# Patient Record
Sex: Male | Born: 1995
Health system: Southern US, Community
[De-identification: ages and names within clinical notes are randomized; demographics above are authoritative.]

## PROBLEM LIST (undated history)

## (undated) DIAGNOSIS — I10 Essential (primary) hypertension: Secondary | ICD-10-CM

## (undated) DIAGNOSIS — B20 Human immunodeficiency virus [HIV] disease: Secondary | ICD-10-CM

## (undated) DIAGNOSIS — R569 Unspecified convulsions: Secondary | ICD-10-CM

---
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
## 2018-12-22 ENCOUNTER — Emergency Department (HOSPITAL_COMMUNITY)
Admission: EM | Admit: 2018-12-22 | Discharge: 2018-12-22 | Disposition: A | Discharge status: Home / Self Care | Payer: Self-pay | Attending: Emergency Medicine | Admitting: Emergency Medicine

## 2018-12-22 ENCOUNTER — Encounter (HOSPITAL_COMMUNITY): Payer: Self-pay | Admitting: Emergency Medicine

## 2018-12-22 ENCOUNTER — Emergency Department (HOSPITAL_COMMUNITY): Payer: Self-pay

## 2018-12-22 ENCOUNTER — Other Ambulatory Visit: Payer: Self-pay

## 2018-12-22 DIAGNOSIS — R569 Unspecified convulsions: Secondary | ICD-10-CM | POA: Insufficient documentation

## 2018-12-22 DIAGNOSIS — F17210 Nicotine dependence, cigarettes, uncomplicated: Secondary | ICD-10-CM | POA: Insufficient documentation

## 2018-12-22 DIAGNOSIS — I10 Essential (primary) hypertension: Secondary | ICD-10-CM | POA: Insufficient documentation

## 2018-12-22 HISTORY — DX: Essential (primary) hypertension: I10

## 2018-12-22 LAB — CBC WITH DIFFERENTIAL/PLATELET
Abs Immature Granulocytes: 0.02 10*3/uL (ref 0.00–0.07)
Basophils Absolute: 0 10*3/uL (ref 0.0–0.1)
Basophils Relative: 0 %
Eosinophils Absolute: 0 10*3/uL (ref 0.0–0.5)
Eosinophils Relative: 0 %
HCT: 40.7 % (ref 39.0–52.0)
Hemoglobin: 13 g/dL (ref 13.0–17.0)
Immature Granulocytes: 1 %
Lymphocytes Relative: 39 %
Lymphs Abs: 1.2 10*3/uL (ref 0.7–4.0)
MCH: 27.1 pg (ref 26.0–34.0)
MCHC: 31.9 g/dL (ref 30.0–36.0)
MCV: 84.8 fL (ref 80.0–100.0)
Monocytes Absolute: 0.2 10*3/uL (ref 0.1–1.0)
Monocytes Relative: 8 %
Neutro Abs: 1.6 10*3/uL — ABNORMAL LOW (ref 1.7–7.7)
Neutrophils Relative %: 52 %
Platelets: 160 10*3/uL (ref 150–400)
RBC: 4.8 MIL/uL (ref 4.22–5.81)
RDW: 12.9 % (ref 11.5–15.5)
WBC: 3.1 10*3/uL — ABNORMAL LOW (ref 4.0–10.5)
nRBC: 0 % (ref 0.0–0.2)

## 2018-12-22 LAB — COMPREHENSIVE METABOLIC PANEL
ALT: 203 U/L — ABNORMAL HIGH (ref 0–44)
AST: 231 U/L — ABNORMAL HIGH (ref 15–41)
Albumin: 3.5 g/dL (ref 3.5–5.0)
Alkaline Phosphatase: 35 U/L — ABNORMAL LOW (ref 38–126)
Anion gap: 10 (ref 5–15)
BUN: 8 mg/dL (ref 6–20)
CO2: 25 mmol/L (ref 22–32)
Calcium: 9.1 mg/dL (ref 8.9–10.3)
Chloride: 103 mmol/L (ref 98–111)
Creatinine, Ser: 0.46 mg/dL — ABNORMAL LOW (ref 0.61–1.24)
GFR calc Af Amer: >60 mL/min (ref >60)
GFR calc non Af Amer: >60 mL/min (ref >60)
Glucose, Bld: 85 mg/dL (ref 70–99)
Potassium: 3.8 mmol/L (ref 3.5–5.1)
Sodium: 138 mmol/L (ref 135–145)
Total Bilirubin: 0.5 mg/dL (ref 0.3–1.2)
Total Protein: 7.7 g/dL (ref 6.5–8.1)

## 2018-12-22 LAB — URINALYSIS, ROUTINE W REFLEX MICROSCOPIC
Bilirubin Urine: NEGATIVE
Glucose, UA: NEGATIVE mg/dL
Ketones, ur: NEGATIVE mg/dL
Leukocytes,Ua: NEGATIVE
Nitrite: NEGATIVE
Protein, ur: NEGATIVE mg/dL
Specific Gravity, Urine: 1.021 (ref 1.005–1.030)
pH: 5 (ref 5.0–8.0)

## 2018-12-22 LAB — MAGNESIUM: Magnesium: 2 mg/dL (ref 1.7–2.4)

## 2018-12-22 LAB — ETHANOL: Alcohol, Ethyl (B): <10 mg/dL (ref <10)

## 2018-12-22 LAB — PHOSPHORUS: Phosphorus: 2.7 mg/dL (ref 2.5–4.6)

## 2018-12-22 LAB — CBG MONITORING, ED: Glucose-Capillary: 76 mg/dL (ref 70–99)

## 2018-12-22 NOTE — ED Notes (Signed)
Pts aunt is in waiting area ready to pick pt up. Patient verbalizes understanding of discharge instructions. Opportunity for questioning and answers were provided. Armband removed by staff, pt discharged from ED.

## 2018-12-22 NOTE — ED Provider Notes (Signed)
[facility] Provider Note   CSN: [id number] Arrival date & time: 12/22/18  [id number]    History   Chief Complaint Chief Complaint  Patient presents with  . Loss of Consciousness    HPI Jeremy DoppMarcus Lane is a 23 y.o. male with no PMHx presenting to the ED after possible seizure activity. Patient reports the last thing he remembers was getting off the bus as he was heading to court and he felt his muscles tense up before he blacked out. He states about 3 days ago he started feeling numbness and weakness in his left leg that has made it difficult to ambulate. Endorses headache, SOB and nausea. He denies any history of seizures, recent injuries or trauma, no chest pain, palpitations, lightheadedness or dizziness, fever, chills, abdominal pain, vomiting, constipation, diarrhea or difficulty urinating. He has a history of heavy alcohol use but states he has not drank in about 3 weeks. Reports using marijuana a couple times a week. No known family history of seizures or arrhythmias.     HPI  Past Medical History:  Diagnosis Date  . Hypertension     There are no active problems to display for this patient.    Home Medications    Prior to Admission medications   Not on File    Family History No family history on file.  Social History Social History   Tobacco Use  . Smoking status: Current Every Day Smoker    Packs/day: 0.20    Types: Cigarettes  . Smokeless tobacco: Never Used  Substance Use Topics  . Alcohol use: Yes    Comment: states he has been drinking more lately because he is stressed  . Drug use: Yes    Types: Marijuana, Cocaine    Comment: cocaine "once in a blue moon"     Allergies   Patient has no known allergies.   Review of Systems Review of Systems  Constitutional: Negative for appetite change, chills, diaphoresis, fatigue and fever.  HENT: Negative for congestion, rhinorrhea and sore throat.   Eyes: Negative for  photophobia, pain, redness and visual disturbance.  Respiratory: Positive for shortness of breath. Negative for chest tightness.   Cardiovascular: Negative for chest pain and palpitations.  Gastrointestinal: Positive for nausea. Negative for abdominal pain, constipation, diarrhea and vomiting.  Genitourinary: Negative for difficulty urinating, flank pain and hematuria.  Musculoskeletal: Positive for gait problem. Negative for neck pain and neck stiffness.  Neurological: Positive for weakness, numbness and headaches. Negative for dizziness.  Psychiatric/Behavioral: Negative for agitation and confusion. The patient is not nervous/anxious.      Physical Exam Updated Vital Signs BP 117/72   Pulse 75   Temp 98.4 F (36.9 C) (Oral)   Resp (!) 21   Ht 5\' 4"  (1.626 m)   Wt 63.5 kg   SpO2 100%   BMI 24.03 kg/m   Physical Exam Constitutional:      General: He is not in acute distress.    Appearance: Normal appearance. He is normal weight. He is not ill-appearing or toxic-appearing.  HENT:     Head: Normocephalic and atraumatic.     Mouth/Throat:     Mouth: Mucous membranes are dry.     Pharynx: Oropharynx is clear.  Eyes:     General:        Right eye: No discharge.        Left eye: No discharge.     Extraocular Movements: Extraocular movements intact.  Conjunctiva/sclera: Conjunctivae normal.     Pupils: Pupils are equal, round, and reactive to light.  Neck:     Musculoskeletal: Normal range of motion and neck supple. No neck rigidity or muscular tenderness.  Cardiovascular:     Rate and Rhythm: Normal rate and regular rhythm.     Pulses: Normal pulses.     Heart sounds: Normal heart sounds. No murmur. No friction rub. No gallop.   Pulmonary:     Effort: Pulmonary effort is normal. No respiratory distress.     Breath sounds: Normal breath sounds. No wheezing or rales.  Chest:     Chest wall: No tenderness.  Abdominal:     General: Abdomen is flat. Bowel sounds are  normal. There is no distension.     Palpations: Abdomen is soft.     Tenderness: There is abdominal tenderness. There is no guarding.  Musculoskeletal:        General: No swelling or tenderness.     Right lower leg: No edema.     Left lower leg: No edema.  Skin:    General: Skin is warm and dry.  Neurological:     Mental Status: He is alert and oriented to person, place, and time.     Cranial Nerves: No cranial nerve deficit.     Sensory: No sensory deficit.     Motor: Weakness present.     Comments: Decreased strength of LLE, 3/5  Psychiatric:        Mood and Affect: Mood normal.        Behavior: Behavior normal.        Thought Content: Thought content normal.        Judgment: Judgment normal.      ED Treatments / Results  Labs (all labs ordered are listed, but only abnormal results are displayed) Labs Reviewed  COMPREHENSIVE METABOLIC PANEL - Abnormal; Notable for the following components:      Result Value   Creatinine, Ser 0.46 (*)    AST 231 (*)    ALT 203 (*)    Alkaline Phosphatase 35 (*)    All other components within normal limits  CBC WITH DIFFERENTIAL/PLATELET - Abnormal; Notable for the following components:   WBC 3.1 (*)    Neutro Abs 1.6 (*)    All other components within normal limits  URINALYSIS, ROUTINE W REFLEX MICROSCOPIC - Abnormal; Notable for the following components:   Hgb urine dipstick MODERATE (*)    Bacteria, UA RARE (*)    All other components within normal limits  ETHANOL  MAGNESIUM  PHOSPHORUS  CBG MONITORING, ED    EKG EKG Interpretation  Date/Time:  Monday Dec 22 2018 10:03:48 EDT Ventricular Rate:  68 PR Interval:    QRS Duration: 85 QT Interval:  415 QTC Calculation: 442 R Axis:   -2 Text Interpretation:  Sinus rhythm RSR' in V1 or V2, probably normal variant no wpw, prolonged qt or brugada No old tracing to compare Confirmed by Melene Plan [phone]) on 12/22/2018 12:47:19 PM   Radiology Dg Ribs Unilateral W/chest Left   Result Date: 12/22/2018 CLINICAL DATA:  Left rib pain after fall. EXAM: LEFT RIBS AND CHEST - 3+ VIEW COMPARISON:  None. FINDINGS: No fracture or other bone lesions are seen involving the ribs. There is no evidence of pneumothorax or pleural effusion. Both lungs are clear. Heart size and mediastinal contours are within normal limits. IMPRESSION: Negative. Electronically Signed   By: Lupita Raider M.D.   On:  12/22/2018 12:04   Ct Head Wo Contrast  Result Date: 12/22/2018 CLINICAL DATA:  Syncopal episode with suspected seizure EXAM: CT HEAD WITHOUT CONTRAST TECHNIQUE: Contiguous axial images were obtained from the base of the skull through the vertex without intravenous contrast. COMPARISON:  None. FINDINGS: Brain: The ventricles are normal in size and configuration. There is no intracranial mass, hemorrhage, extra-axial fluid collection, or midline shift. The brain parenchyma appears unremarkable. No evident acute infarct. Vascular: No hyperdense vessel. There is no demonstrable vascular calcification. Skull: The bony calvarium appears intact. Sinuses/Orbits: There is opacification in several ethmoid air cells bilaterally. There is mucosal thickening in the anterior superior right maxillary antrum. Orbits appear symmetric bilaterally. Other: Mastoid air cells are clear. IMPRESSION: Areas of paranasal sinus disease.  Study otherwise unremarkable. Electronically Signed   By: Bretta Bang III M.D.   On: 12/22/2018 13:09    Procedures Procedures (including critical care time)  Medications Ordered in ED Medications - No data to display   Initial Impression / Assessment and Plan / ED Course  I have reviewed the triage vital signs and the nursing notes.  Pertinent labs & imaging results that were available during my care of the patient were reviewed by me and considered in my medical decision making (see chart for details).  Pt is a 23 yo presenting to the ED after possible seizure like activity.  Patient was confused upon EMS arrival, appeared post-ictal. He was getting off a bus this morning and the last thing he recalls was his muscles tensing up before he blacked out. He reports left leg weakness and numbness that started three days ago. No history of seizures or syncope. Endorses headache, SOB and nausea. Also states he recently quit drinking alcohol about 3 weeks ago, prior to this he reports drinking heavily. No history of alcohol withdrawal. Will order seizure workup and CT head.   CBG 135. UA shows moderate hgb. Mild transaminitis. CT head unremarkable. Patient is stable for discharge home. Will advise patient to avoid driving, swimming, climbing ladders etc until he is cleared by his PCP or neurology.   Final Clinical Impressions(s) / ED Diagnoses   Final diagnoses:  Seizure-like activity [facility])    ED Discharge Orders    None       Rehman, Areeg N, DO 12/22/18 1340    Melene Plan, DO 12/22/18 1349

## 2018-12-22 NOTE — ED Notes (Signed)
Patient transported to xray/CT. 

## 2018-12-22 NOTE — ED Triage Notes (Signed)
Patient in via [facility] after possible seizure - patient remembers getting off the bus this morning while hading to court and states he felt his foot go numb, muscles tense up, and then he blacked out. He denies hx seizures, denies pain or any recent symptoms at all. No recent drug use or ETOH. Patient confused upon EMS arrival, appeared post-ictal, but is now A&O x 4. Incontinent of bowel. No tongue injury or obvious injury. Only history HTN, no medications.  EMS VS: 132/96, HR 80 NSR, 100% RA, 96.63F temporal, CBG 135. 18g. PIV LFA.

## 2018-12-22 NOTE — Discharge Instructions (Addendum)
You presented to the ED with possible seizure like activity. Your labs and head CT were normal. Please do not drive, swim, or any other activities that would be dangerous if you had another seizure. I want you to call [phone] for a PCP or follow up with neurology in 1 week.  I have provided their contact information.  If your symptoms persist or worsen or if you have any more seizure like activity please return to the ED.

## 2018-12-22 NOTE — ED Notes (Signed)
Patient left his fiance, Leeanne Rio, a voicemail. He can be reached at [phone].

## 2018-12-27 ENCOUNTER — Observation Stay (HOSPITAL_COMMUNITY): Payer: Self-pay

## 2018-12-27 ENCOUNTER — Encounter (HOSPITAL_COMMUNITY): Payer: Self-pay | Admitting: Emergency Medicine

## 2018-12-27 ENCOUNTER — Inpatient Hospital Stay (HOSPITAL_COMMUNITY)
Admission: EM | Admit: 2018-12-27 | Discharge: 2019-01-03 | DRG: 975 | Discharge status: Against Medical Advice | Payer: Self-pay | Attending: Internal Medicine | Admitting: Internal Medicine

## 2018-12-27 DIAGNOSIS — R748 Abnormal levels of other serum enzymes: Secondary | ICD-10-CM

## 2018-12-27 DIAGNOSIS — F603 Borderline personality disorder: Secondary | ICD-10-CM | POA: Diagnosis present

## 2018-12-27 DIAGNOSIS — M6282 Rhabdomyolysis: Secondary | ICD-10-CM | POA: Diagnosis present

## 2018-12-27 DIAGNOSIS — F4329 Adjustment disorder with other symptoms: Secondary | ICD-10-CM | POA: Diagnosis present

## 2018-12-27 DIAGNOSIS — F1011 Alcohol abuse, in remission: Secondary | ICD-10-CM | POA: Diagnosis present

## 2018-12-27 DIAGNOSIS — B20 Human immunodeficiency virus [HIV] disease: Secondary | ICD-10-CM

## 2018-12-27 DIAGNOSIS — G40201 Localization-related (focal) (partial) symptomatic epilepsy and epileptic syndromes with complex partial seizures, not intractable, with status epilepticus: Secondary | ICD-10-CM

## 2018-12-27 DIAGNOSIS — F319 Bipolar disorder, unspecified: Secondary | ICD-10-CM | POA: Diagnosis present

## 2018-12-27 DIAGNOSIS — I10 Essential (primary) hypertension: Secondary | ICD-10-CM | POA: Diagnosis present

## 2018-12-27 DIAGNOSIS — R45851 Suicidal ideations: Secondary | ICD-10-CM | POA: Diagnosis present

## 2018-12-27 DIAGNOSIS — Z20828 Contact with and (suspected) exposure to other viral communicable diseases: Secondary | ICD-10-CM | POA: Diagnosis present

## 2018-12-27 DIAGNOSIS — R7989 Other specified abnormal findings of blood chemistry: Secondary | ICD-10-CM | POA: Diagnosis present

## 2018-12-27 DIAGNOSIS — G9349 Other encephalopathy: Secondary | ICD-10-CM | POA: Diagnosis present

## 2018-12-27 DIAGNOSIS — F1910 Other psychoactive substance abuse, uncomplicated: Secondary | ICD-10-CM | POA: Diagnosis not present

## 2018-12-27 DIAGNOSIS — R569 Unspecified convulsions: Secondary | ICD-10-CM

## 2018-12-27 DIAGNOSIS — F17210 Nicotine dependence, cigarettes, uncomplicated: Secondary | ICD-10-CM | POA: Diagnosis present

## 2018-12-27 DIAGNOSIS — F4320 Adjustment disorder, unspecified: Secondary | ICD-10-CM | POA: Diagnosis present

## 2018-12-27 HISTORY — DX: Human immunodeficiency virus (HIV) disease: B20

## 2018-12-27 LAB — COMPREHENSIVE METABOLIC PANEL
ALT: 187 U/L — ABNORMAL HIGH (ref 0–44)
AST: 199 U/L — ABNORMAL HIGH (ref 15–41)
Albumin: 3.5 g/dL (ref 3.5–5.0)
Alkaline Phosphatase: 39 U/L (ref 38–126)
Anion gap: 9 (ref 5–15)
BUN: 9 mg/dL (ref 6–20)
CO2: 26 mmol/L (ref 22–32)
Calcium: 9.4 mg/dL (ref 8.9–10.3)
Chloride: 101 mmol/L (ref 98–111)
Creatinine, Ser: 0.42 mg/dL — ABNORMAL LOW (ref 0.61–1.24)
GFR calc Af Amer: >60 mL/min (ref >60)
GFR calc non Af Amer: >60 mL/min (ref >60)
Glucose, Bld: 85 mg/dL (ref 70–99)
Potassium: 3.8 mmol/L (ref 3.5–5.1)
Sodium: 136 mmol/L (ref 135–145)
Total Bilirubin: 0.6 mg/dL (ref 0.3–1.2)
Total Protein: 7.5 g/dL (ref 6.5–8.1)

## 2018-12-27 LAB — CSF CELL COUNT WITH DIFFERENTIAL
RBC Count, CSF: 83 /mm3 — ABNORMAL HIGH
Tube #: 3
WBC, CSF: 0 /mm3 (ref 0–5)

## 2018-12-27 LAB — CBC WITH DIFFERENTIAL/PLATELET
Abs Immature Granulocytes: 0.01 10*3/uL (ref 0.00–0.07)
Basophils Absolute: 0 10*3/uL (ref 0.0–0.1)
Basophils Relative: 0 %
Eosinophils Absolute: 0 10*3/uL (ref 0.0–0.5)
Eosinophils Relative: 1 %
HCT: 38.1 % — ABNORMAL LOW (ref 39.0–52.0)
Hemoglobin: 12.4 g/dL — ABNORMAL LOW (ref 13.0–17.0)
Immature Granulocytes: 0 %
Lymphocytes Relative: 65 %
Lymphs Abs: 2.1 10*3/uL (ref 0.7–4.0)
MCH: 27.6 pg (ref 26.0–34.0)
MCHC: 32.5 g/dL (ref 30.0–36.0)
MCV: 84.9 fL (ref 80.0–100.0)
Monocytes Absolute: 0.3 10*3/uL (ref 0.1–1.0)
Monocytes Relative: 9 %
Neutro Abs: 0.8 10*3/uL — ABNORMAL LOW (ref 1.7–7.7)
Neutrophils Relative %: 25 %
Platelets: 176 10*3/uL (ref 150–400)
RBC: 4.49 MIL/uL (ref 4.22–5.81)
RDW: 12.7 % (ref 11.5–15.5)
Smear Review: ADEQUATE
WBC: 3.2 10*3/uL — ABNORMAL LOW (ref 4.0–10.5)
nRBC: 0 % (ref 0.0–0.2)

## 2018-12-27 LAB — SALICYLATE LEVEL: Salicylate Lvl: <7 mg/dL (ref 2.8–30.0)

## 2018-12-27 LAB — RAPID URINE DRUG SCREEN, HOSP PERFORMED
Amphetamines: NOT DETECTED
Barbiturates: NOT DETECTED
Benzodiazepines: NOT DETECTED
Cocaine: NOT DETECTED
Opiates: NOT DETECTED
Tetrahydrocannabinol: POSITIVE — AB

## 2018-12-27 LAB — PHOSPHORUS: Phosphorus: 4.5 mg/dL (ref 2.5–4.6)

## 2018-12-27 LAB — PROTEIN AND GLUCOSE, CSF
Glucose, CSF: 53 mg/dL (ref 40–70)
Total  Protein, CSF: 27 mg/dL (ref 15–45)

## 2018-12-27 LAB — SARS CORONAVIRUS 2 BY RT PCR (HOSPITAL ORDER, PERFORMED IN ~~LOC~~ HOSPITAL LAB): SARS Coronavirus 2: NEGATIVE

## 2018-12-27 LAB — PROTEIN, CSF: Total  Protein, CSF: 39 mg/dL (ref 15–45)

## 2018-12-27 LAB — MAGNESIUM: Magnesium: 1.9 mg/dL (ref 1.7–2.4)

## 2018-12-27 LAB — GLUCOSE, CSF: Glucose, CSF: 54 mg/dL (ref 40–70)

## 2018-12-27 LAB — CBG MONITORING, ED: Glucose-Capillary: 89 mg/dL (ref 70–99)

## 2018-12-27 LAB — ACETAMINOPHEN LEVEL: Acetaminophen (Tylenol), Serum: <10 ug/mL — ABNORMAL LOW (ref 10–30)

## 2018-12-27 LAB — ETHANOL: Alcohol, Ethyl (B): <10 mg/dL (ref <10)

## 2018-12-27 MED ORDER — ONDANSETRON HCL 4 MG/2ML IJ SOLN: 4.0000 mg | Freq: Four times a day (QID) | INTRAMUSCULAR | Status: DC | PRN

## 2018-12-27 MED ORDER — LORAZEPAM 2 MG/ML IJ SOLN
1.0000 mg | Freq: Once | INTRAMUSCULAR | Status: AC
  Administered 2018-12-27: 1 mg via INTRAVENOUS
  Filled 2018-12-27: qty 1

## 2018-12-27 MED ORDER — ONDANSETRON HCL 4 MG PO TABS: 4.0000 mg | ORAL_TABLET | Freq: Four times a day (QID) | ORAL | Status: DC | PRN

## 2018-12-27 MED ORDER — SODIUM CHLORIDE 0.9 % IV SOLN
1300.0000 mg | Freq: Once | INTRAVENOUS | Status: AC
  Administered 2018-12-27: 1300 mg via INTRAVENOUS
  Filled 2018-12-27: qty 26

## 2018-12-27 MED ORDER — ACETAMINOPHEN 325 MG PO TABS
650.0000 mg | ORAL_TABLET | ORAL | Status: DC | PRN
  Administered 2018-12-28 – 2019-01-03 (×4): 650 mg via ORAL
  Filled 2018-12-27 (×4): qty 2

## 2018-12-27 MED ORDER — GADOBUTROL 1 MMOL/ML IV SOLN
6.0000 mL | Freq: Once | INTRAVENOUS | Status: AC | PRN
  Administered 2018-12-27: 6 mL via INTRAVENOUS

## 2018-12-27 MED ORDER — SODIUM CHLORIDE 0.9 % IV SOLN
75.0000 mL/h | INTRAVENOUS | Status: DC
  Administered 2018-12-27 – 2018-12-28 (×2): 75 mL/h via INTRAVENOUS

## 2018-12-27 MED ORDER — DIPHENHYDRAMINE HCL 50 MG/ML IJ SOLN
25.0000 mg | Freq: Once | INTRAMUSCULAR | Status: AC
  Administered 2018-12-27: 25 mg via INTRAVENOUS
  Filled 2018-12-27: qty 1

## 2018-12-27 MED ORDER — LORAZEPAM 2 MG/ML IJ SOLN
1.0000 mg | INTRAMUSCULAR | Status: DC | PRN
  Administered 2018-12-30 – 2019-01-03 (×5): 2 mg via INTRAVENOUS
  Filled 2018-12-27 (×5): qty 1

## 2018-12-27 MED ORDER — DOCUSATE SODIUM 100 MG PO CAPS
100.0000 mg | ORAL_CAPSULE | Freq: Two times a day (BID) | ORAL | Status: DC
  Administered 2018-12-27 – 2019-01-03 (×13): 100 mg via ORAL
  Filled 2018-12-27 (×14): qty 1

## 2018-12-27 MED ORDER — LEVETIRACETAM IN NACL 1000 MG/100ML IV SOLN
1000.0000 mg | Freq: Two times a day (BID) | INTRAVENOUS | Status: DC
  Administered 2018-12-27 – 2018-12-30 (×7): 1000 mg via INTRAVENOUS
  Filled 2018-12-27 (×8): qty 100

## 2018-12-27 MED ORDER — POLYETHYLENE GLYCOL 3350 17 G PO PACK: 17.0000 g | PACK | Freq: Every day | ORAL | Status: DC | PRN

## 2018-12-27 MED ORDER — ACETAMINOPHEN 650 MG RE SUPP: 650.0000 mg | RECTAL | Status: DC | PRN

## 2018-12-27 NOTE — ED Notes (Signed)
Asked to pee. PT stated he maybe would later with food and drink. PT told that they cant have anything yet.

## 2018-12-27 NOTE — ED Notes (Signed)
Pt began itching as soon as medication started, no hives noted, PA notified.

## 2018-12-27 NOTE — H&P (Signed)
History and Physical    Jeremy DoppMarcus Lassalle WUJ:[id number] DOB: [birth year] DOA: 12/27/2018  PCP: Patient, No Pcp Per Consultants:  None Patient coming from:  Home - lives with significant other, Leeanne RioAnthony Johnson; NOK: Listed as Leeanne RioAnthony Johnson, [phone] - however, the phone number did not work  Chief Complaint: leg twitching  HPI: Jeremy Lane is a 23 y.o. male with medical history significant of HTN presenting with unilateral leg twitching.  The patient was quite somnolent at the time of my evaluation and was able to answer only a few questions.  He denied fever.  HPI per Dr. Laurence SlateAroor:  Jeremy DoppMarcus Reindl is a 23 y.o. male with past medical history of hypertension, heavy alcohol abuse, marijuana use presents to the ED with continuous seizure-like activity since 5 days.  Per description from the EDP, left leg had continuous rhythmic activity from hip downwards. The patient states that this is been going on for about 5 days.  Patient was seen in the emergency department on 5/18 after presenting with possible seizure-like activity. He has some left pain and tingling followed by jerking. This happened just prior to him having to go to court. Patient was discharged from the ED with plan to follow-up with neurologist.  Patient states that since his discharge he has been having continuous jerking of the left leg that has persisted throughout the day. Patient was given 1 mg of Ativan and received a load of Dilantin.  On assessment, patient had received Dilantin and his leg jerking had stopped. Patient also started itching after receiving Dilantin.   ED Course:  Carryover, per Dr. Antionette Charpyd:  23 yom with hx of alcohol abuse in remission, p/w focal seizures involving left leg. Neuro (Dr. Wilford CornerArora) on board. Being loaded with phosphenytoin and MRI brain pending.  Seizure-like activity on 5/18 but had a court date and concern for malingering.  Today with complex partial seizure since Monday which resolved with  fosphenytoin and Ativan.  Review of Systems: Unable to perform  Ambulatory Status:  Ambulates without assistance  Past Medical History:  Diagnosis Date  . Hypertension     History reviewed. No pertinent surgical history.  Social History   Socioeconomic History  . Marital status: Single    Spouse name: Not on file  . Number of children: Not on file  . Years of education: Not on file  . Highest education level: Not on file  Occupational History  . Not on file  Social Needs  . Financial resource strain: Not on file  . Food insecurity:    Worry: Not on file    Inability: Not on file  . Transportation needs:    Medical: Not on file    Non-medical: Not on file  Tobacco Use  . Smoking status: Current Every Day Smoker    Packs/day: 0.20    Types: Cigarettes  . Smokeless tobacco: Never Used  Substance and Sexual Activity  . Alcohol use: Yes    Comment: states he has been drinking more lately because he is stressed  . Drug use: Yes    Types: Marijuana, Cocaine    Comment: cocaine "once in a blue moon"  . Sexual activity: Not on file  Lifestyle  . Physical activity:    Days per week: Not on file    Minutes per session: Not on file  . Stress: Not on file  Relationships  . Social connections:    Talks on phone: Not on file    Gets together: Not on file  Attends religious service: Not on file    Active member of club or organization: Not on file    Attends meetings of clubs or organizations: Not on file    Relationship status: Not on file  . Intimate partner violence:    Fear of current or ex partner: Not on file    Emotionally abused: Not on file    Physically abused: Not on file    Forced sexual activity: Not on file  Other Topics Concern  . Not on file  Social History Narrative  . Not on file    Allergies  Allergen Reactions  . Fosphenytoin Itching    No family history on file.  Prior to Admission medications   Not on File    Physical Exam:  Vitals:   12/27/18 0600 12/27/18 0735 12/27/18 0845 12/27/18 1153  BP: 103/65 112/75 95/65 108/71  Pulse:  62 (!) 54 (!) 56  Resp: 16 14 16 17   Temp:   (!) 97.5 F (36.4 C) 97.7 F (36.5 C)  TempSrc:   Oral Axillary  SpO2:  100% 97% 98%  Weight:      Height:         . General:  Appears calm and comfortable and is NAD; very somnolent, briefly arouses and goes back to sleep . Eyes:  PERRL, EOMI, normal lids, iris . ENT:  grossly normal hearing, lips & tongue, mmm . Neck:  no LAD, masses or thyromegaly . Cardiovascular:  RRR, no m/r/g. No LE edema.  Marland Kitchen Respiratory:   CTA bilaterally with no wheezes/rales/rhonchi.  Normal respiratory effort. . Abdomen:  soft, NT, ND, NABS . Skin:  no rash or induration seen on limited exam . Musculoskeletal:  grossly normal tone BUE/BLE, good ROM, no bony abnormality . Psychiatric:  Somnolent, answers only limited questions and lapses back to sleep Neurologic:  CN 2-12 grossly intact, appears to grossly move all extremities in coordinated fashion    Radiological Exams on Admission: Mr Laqueta Jean And Wo Contrast  Result Date: 12/27/2018 CLINICAL DATA:  Seizure 12/22/2018.  Left leg pain. EXAM: MRI HEAD WITHOUT AND WITH CONTRAST TECHNIQUE: Multiplanar, multiecho pulse sequences of the brain and surrounding structures were obtained without and with intravenous contrast. CONTRAST:  6 mL Gadovist IV COMPARISON:  CT head 12/22/2018 FINDINGS: Brain: Cortical edema in the high right parietal lobe with relative sparing of the white matter. No associated restricted diffusion or enhancement. This area measures approximately 2.5 cm in diameter. No other areas of cortical edema. Normal white matter. Negative for acute infarct. No demyelinating disease or hemorrhage. Normal enhancement postcontrast administration. Vascular: Normal arterial flow voids. Normal venous sinus enhancement. Skull and upper cervical spine: Negative Sinuses/Orbits: Prominent hypertrophy of the  adenoid tissue. Mucosal edema paranasal sinuses. Negative orbit. Mastoid clear bilaterally Other: None IMPRESSION: Cortical edema high right parietal lobe without restricted diffusion or enhancement. This is likely related to the patient's recent seizure activity. Differential diagnosis includes encephalitis and low-grade tumor. Follow-up MRI brain without with contrast recommended 2-3 months to evaluate for resolution. Electronically Signed   By: Marlan Palau M.D.   On: 12/27/2018 07:32    EKG: Independently reviewed.  NSR with rate 68; nonspecific ST changes with no evidence of acute ischemia   Labs on Admission: I have personally reviewed the available labs and imaging studies at the time of the admission.  Pertinent labs:   AST 199/ALT 187 CMP otherwise unremarkable WBC 3.2 Hgb 12.4 APAP <10 ASA <7 UDS positive for marijuana  COVID negative  Assessment/Plan Principal Problem:   Seizure (HCC) Active Problems:   Essential hypertension   Elevated LFTs   Polysubstance abuse (HCC)   Seizure -Patient without known h/o seizures presenting with focal status epilepticus -He initially presented to the ER on 5/18 with possible seizure-like activity, but there was a question of nonorganic etiology related to the timing of the visit (on his way to court); he did report left leg pain and tingling.  He was referred for outpatient neurology follow-up. -He returned today with persistent pain and rhythmic twitching since, significantly worsened in the 24 hours prior to presentation -There was concern for focal status epilepticus and he was given fosphenytoin with resolution of his symptoms -Neurology has seen the patient and recommended MRI brain -MRI showed cortical edema in the high right parietal lobe with differential including encephalitis and low-grade tumor -He was started on Keppra 1 gram BID (itching developed after fosphenytoin) -Will order EEG  -He will need driving restriction for  at least 6 months -Seizure precautions -Ativan prn -He will need a repeat brain MRI in 2-3 months to ensure resolution of abnormal findings -LP was performed by IR and results are pending including cell count, diff, culture; protein, glucose; HSV: lymphocyte subsets; and cytology -RPR and HIV added  Elevated LFTs -Patient with h/o "heavy alcohol abuse" - may be related -With ? Of encephalitis, I have ordered HIV and RPR -Hepatitis panel ordered -Recheck CMP in AM  Polysubstance abuse -Reports occasional use of cocaine with more regular/heavy use of marijuana and alcohol -Needs ongoing counseling once more awake and alert  HTN -Reported on history, but he does not appear to be taking medications for this issue.   Note: This patient has been tested and is negative for the novel coronavirus COVID-19.  DVT prophylaxis:  SCDs Code Status:  Full  Family Communication: None present; I was unable to reach his listed NOK by telephone  Disposition Plan:  Home once clinically improved Consults called: Neurology; IR  Admission status: It is my clinical opinion that referral for OBSERVATION is reasonable and necessary in this patient based on the above information provided. The aforementioned taken together are felt to place the patient at high risk for further clinical deterioration. However it is anticipated that the patient may be medically stable for discharge from the hospital within 24 to 48 hours.    Jonah Blue MD [facility]   How to contact the [facility] Attending or Consulting provider 7A - 7P or covering provider during after hours 7P -7A, for this patient?  1. Check the care team in [facility] and look for a) attending/consulting [facility] provider listed and b) the [facility] team listed 2. Log into [email or web address] and use [place]'s universal password to access. If you do not have the password, please contact the hospital operator. 3. Locate the [facility] provider you are looking for under [facility] and page to a number that you can be directly reached. 4. If you still have difficulty reaching the provider, please page the [facility] (Director on Call) for the Hospitalists listed on amion for assistance.   12/27/2018, 1:16 PM

## 2018-12-27 NOTE — ED Provider Notes (Signed)
[facility] EMERGENCY DEPARTMENT Provider Note   CSN: [id number] Arrival date & time: 12/27/18  0224    History   Chief Complaint Chief Complaint  Patient presents with  . Leg Pain    HPI Jeremy Lane is a 23 y.o. male with a h/o of HTN who presents to the Emergency Department by EMS with a chief complaint of left leg pain.  The patient reports pain and rhythmic twitching to the left leg since he was seen in the ER on 12/22/2018 for seizure-like activity.  He reports the symptoms have been constant, but significantly worsened over the last 24 hours.  He reports that he has been unable to walk since this afternoon when the symptoms worsened.  No known aggravating or alleviating factors.  He denies any pain or twitching to the right leg or bilateral arms.  No history of similar.  No treatment prior to arrival.  He denies fever, chills, numbness, weakness, urinary or fecal incontinence, visual changes, headache, nausea, vomiting, dizziness, or lightheadedness.     The history is provided by the patient. No language interpreter was used.    Past Medical History:  Diagnosis Date  . Hypertension     Patient Active Problem List   Diagnosis Date Noted  . Seizure (HCC) 12/27/2018    History reviewed. No pertinent surgical history.      Home Medications    Prior to Admission medications   Not on File    Family History No family history on file.  Social History Social History   Tobacco Use  . Smoking status: Current Every Day Smoker    Packs/day: 0.20    Types: Cigarettes  . Smokeless tobacco: Never Used  Substance Use Topics  . Alcohol use: Yes    Comment: states he has been drinking more lately because he is stressed  . Drug use: Yes    Types: Marijuana, Cocaine    Comment: cocaine "once in a blue moon"     Allergies   Fosphenytoin   Review of Systems Review of Systems  Constitutional: Negative for appetite change, chills and fever.   HENT: Negative for congestion and sore throat.   Eyes: Negative for visual disturbance.  Respiratory: Negative for shortness of breath.   Cardiovascular: Negative for chest pain.  Gastrointestinal: Negative for abdominal pain, diarrhea, nausea and vomiting.  Genitourinary: Negative for dysuria.  Musculoskeletal: Positive for arthralgias and myalgias. Negative for back pain, neck pain and neck stiffness.  Skin: Negative for rash.  Allergic/Immunologic: Negative for immunocompromised state.  Neurological: Positive for seizures. Negative for dizziness, syncope, facial asymmetry, speech difficulty, weakness, light-headedness, numbness and headaches.  Psychiatric/Behavioral: Negative for confusion.    Physical Exam Updated Vital Signs BP 112/75 (BP Location: Right Arm)   Pulse 62   Temp 97.9 F (36.6 C) (Oral)   Resp 14   Ht  (1.676 m)   Wt 64.9 kg   SpO2 100%   BMI 23.08 kg/m   Physical Exam Vitals signs and nursing note reviewed.  Constitutional:      Appearance: He is well-developed.  HENT:     Head: Normocephalic.  Eyes:     Conjunctiva/sclera: Conjunctivae normal.  Neck:     Musculoskeletal: Neck supple.  Cardiovascular:     Rate and Rhythm: Normal rate and regular rhythm.     Heart sounds: No murmur.  Pulmonary:     Effort: Pulmonary effort is normal.  Abdominal:     General: There is no  distension.     Palpations: Abdomen is soft.  Skin:    General: Skin is warm and dry.  Neurological:     Mental Status: He is alert.     Motor: Seizure activity present.     Gait: Gait abnormal.     Deep Tendon Reflexes: Reflexes abnormal.     Comments: Alert and oriented x3.  Speaks in complete, fluent sentences.  Persistent, rhythmic shaking of the left upper and lower leg. Unable to assess DTR on the left. 2+ DTR on the right.  Psychiatric:        Behavior: Behavior normal.      ED Treatments / Results  Labs (all labs ordered are listed, but only abnormal results  are displayed) Labs Reviewed  CBC WITH DIFFERENTIAL/PLATELET - Abnormal; Notable for the following components:      Result Value   WBC 3.2 (*)    Hemoglobin 12.4 (*)    HCT 38.1 (*)    Neutro Abs 0.8 (*)    All other components within normal limits  COMPREHENSIVE METABOLIC PANEL - Abnormal; Notable for the following components:   Creatinine, Ser 0.42 (*)    AST 199 (*)    ALT 187 (*)    All other components within normal limits  ACETAMINOPHEN LEVEL - Abnormal; Notable for the following components:   Acetaminophen (Tylenol), Serum <10 (*)    All other components within normal limits  SARS CORONAVIRUS 2 (HOSPITAL ORDER, PERFORMED IN [facility])  PHOSPHORUS  MAGNESIUM  ETHANOL  SALICYLATE LEVEL  RAPID URINE DRUG SCREEN, HOSP PERFORMED  HIV ANTIBODY (ROUTINE TESTING W REFLEX)  HEPATITIS PANEL, ACUTE  CBG MONITORING, ED    EKG None  Radiology Mr Laqueta JeanBrain W And Wo Contrast  Result Date: 12/27/2018 CLINICAL DATA:  Seizure 12/22/2018.  Left leg pain. EXAM: MRI HEAD WITHOUT AND WITH CONTRAST TECHNIQUE: Multiplanar, multiecho pulse sequences of the brain and surrounding structures were obtained without and with intravenous contrast. CONTRAST:  6 mL Gadovist IV COMPARISON:  CT head 12/22/2018 FINDINGS: Brain: Cortical edema in the high right parietal lobe with relative sparing of the white matter. No associated restricted diffusion or enhancement. This area measures approximately 2.5 cm in diameter. No other areas of cortical edema. Normal white matter. Negative for acute infarct. No demyelinating disease or hemorrhage. Normal enhancement postcontrast administration. Vascular: Normal arterial flow voids. Normal venous sinus enhancement. Skull and upper cervical spine: Negative Sinuses/Orbits: Prominent hypertrophy of the adenoid tissue. Mucosal edema paranasal sinuses. Negative orbit. Mastoid clear bilaterally Other: None IMPRESSION: Cortical edema high right parietal lobe without  restricted diffusion or enhancement. This is likely related to the patient's recent seizure activity. Differential diagnosis includes encephalitis and low-grade tumor. Follow-up MRI brain without with contrast recommended 2-3 months to evaluate for resolution. Electronically Signed   By: Marlan Palauharles  Clark M.D.   On: 12/27/2018 07:32    Procedures .Critical Care Performed by: Barkley BoardsMcDonald, Avriel Kandel A, PA-C Authorized by: Barkley BoardsMcDonald, Matyas Baisley A, PA-C   Critical care provider statement:    Critical care time (minutes):  40   Critical care time was exclusive of:  Separately billable procedures and treating other patients and teaching time   Critical care was necessary to treat or prevent imminent or life-threatening deterioration of the following conditions:  CNS failure or compromise   Critical care was time spent personally by me on the following activities:  Ordering and performing treatments and interventions, ordering and review of laboratory studies, ordering and review of radiographic  studies, pulse oximetry, re-evaluation of patient's condition, review of old charts, obtaining history from patient or surrogate, examination of patient, evaluation of patient's response to treatment, discussions with consultants and development of treatment plan with patient or surrogate   (including critical care time)  Medications Ordered in ED Medications  levETIRAcetam (KEPPRA) IVPB 1000 mg/100 mL premix (1,000 mg Intravenous New Bag/Given 12/27/18 0733)  0.9 %  sodium chloride infusion (has no administration in time range)  LORazepam (ATIVAN) injection 1-2 mg (has no administration in time range)  acetaminophen (TYLENOL) tablet 650 mg (has no administration in time range)    Or  acetaminophen (TYLENOL) suppository 650 mg (has no administration in time range)  docusate sodium (COLACE) capsule 100 mg (has no administration in time range)  polyethylene glycol (MIRALAX / GLYCOLAX) packet 17 g (has no administration in time  range)  ondansetron (ZOFRAN) tablet 4 mg (has no administration in time range)    Or  ondansetron (ZOFRAN) injection 4 mg (has no administration in time range)  fosPHENYtoin (CEREBYX) 1,300 mg PE in sodium chloride 0.9 % 50 mL IVPB (0 mg PE Intravenous Stopped 12/27/18 0540)  LORazepam (ATIVAN) injection 1 mg (1 mg Intravenous Given 12/27/18 0532)  diphenhydrAMINE (BENADRYL) injection 25 mg (25 mg Intravenous Given 12/27/18 0544)  gadobutrol (GADAVIST) 1 MMOL/ML injection 6 mL (6 mLs Intravenous Contrast Given 12/27/18 0710)     Initial Impression / Assessment and Plan / ED Course  I have reviewed the triage vital signs and the nursing notes.  Pertinent labs & imaging results that were available during my care of the patient were reviewed by me and considered in my medical decision making (see chart for details).        23 year old male with history of hypertension presenting with complex partial seizure of the left lower extremity that the patient reports has been ongoing since he he was evaluated in the ER on 12/22/2018 for seizure-like activity.  Symptoms significantly worse over the last 24 hours.  Patient was seen and evaluated along with Dr. Pollie Meyer, attending physician.  Consulted Dr. Phineas Semen with neurology who recommends 20 mg/kg of fosphenytoin after initially trying the patient on a dose of Ativan given concern for status epilepticus.  Notified by nursing staff after fosphenytoin was given to the patient immediately developed pruritus, but no hives or concern for anaphylaxis.  When I evaluated the patient after fosphenytoin was given, seizure of the left lower extremity had resolved.  He continues to endorse weakness in the left lower extremity, but strength against resistance did appear somewhat improved from previous.  Dr. Laurence Slate also recommends MRI brain with and without contrast and labs with medical admission.  Labs and MRI are pending.  Consulted the hospitalist team and spoke with  Dr. Antionette Char who has accepted the patient for admission.  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

## 2018-12-27 NOTE — ED Notes (Signed)
ED TO INPATIENT HANDOFF REPORT  ED Nurse Name and Phone #: Sharlynn Oliphant  S Name/Age/Gender Jeremy Lane 23 y.o. male Room/Bed: 015C/015C  Code Status   Code Status: Not on file  Home/SNF/Other Home Patient oriented to: self, place, time and situation Is this baseline? Yes   Triage Complete: Triage complete  Chief Complaint leg pain  Triage Note BIB EMS from home. Pt reports L leg pain since 12/22/18 after being seen here for a seizure. Requesting a work note.    Allergies Allergies  Allergen Reactions  . Fosphenytoin Itching    Level of Care/Admitting Diagnosis ED Disposition    ED Disposition Condition Comment   Admit  Hospital Area: [facility] [[id number]]  Level of Care: Progressive [102]  I expect the patient will be discharged within 24 hours: No (not a candidate for 5C-Observation unit)  Covid Evaluation: Screening Protocol (No Symptoms)  Diagnosis: Seizure (HCC) [205090]  Admitting Physician: Briscoe Deutscher [[id number]]  Attending Physician: Briscoe Deutscher [[id number]]  PT Class (Do Not Modify): Observation [104]  PT Acc Code (Do Not Modify): Observation [10022]       B Medical/Surgery History Past Medical History:  Diagnosis Date  . Hypertension    History reviewed. No pertinent surgical history.   A IV Location/Drains/Wounds Patient Lines/Drains/Airways Status   Active Line/Drains/Airways    Name:   Placement date:   Placement time:   Site:   Days:   Peripheral IV 12/27/18 Right Antecubital   12/27/18    0532    Antecubital   less than 1          Intake/Output Last 24 hours  Intake/Output Summary (Last 24 hours) at 12/27/2018 0641 Last data filed at 12/27/2018 0540 Gross per 24 hour  Intake 10 ml  Output -  Net 10 ml    Labs/Imaging Results for orders placed or performed during the hospital encounter of 12/27/18 (from the past 48 hour(s))  CBG monitoring, ED     Status: None   Collection Time: 12/27/18  5:18 AM  Result Value  Ref Range   Glucose-Capillary 89 70 - 99 mg/dL  CBC WITH DIFFERENTIAL     Status: Abnormal (Preliminary result)   Collection Time: 12/27/18  5:38 AM  Result Value Ref Range   WBC 3.2 (L) 4.0 - 10.5 K/uL   RBC 4.49 4.22 - 5.81 MIL/uL   Hemoglobin 12.4 (L) 13.0 - 17.0 g/dL   HCT 14.9 (L) 70.2 - 63.7 %   MCV 84.9 80.0 - 100.0 fL   MCH 27.6 26.0 - 34.0 pg   MCHC 32.5 30.0 - 36.0 g/dL   RDW 85.8 85.0 - 27.7 %   Platelets 176 150 - 400 K/uL   nRBC 0.0 0.0 - 0.2 %    Comment: Performed at [facility], [address], [place], [place] [address]   Neutrophils Relative % PENDING %   Neutro Abs PENDING 1.7 - 7.7 K/uL   Band Neutrophils PENDING %   Lymphocytes Relative PENDING %   Lymphs Abs PENDING 0.7 - 4.0 K/uL   Monocytes Relative PENDING %   Monocytes Absolute PENDING 0.1 - 1.0 K/uL   Eosinophils Relative PENDING %   Eosinophils Absolute PENDING 0.0 - 0.5 K/uL   Basophils Relative PENDING %   Basophils Absolute PENDING 0.0 - 0.1 K/uL   WBC Morphology PENDING    RBC Morphology PENDING    Smear Review PENDING    Other PENDING %   nRBC PENDING  0 /100 WBC   Metamyelocytes Relative PENDING %   Myelocytes PENDING %   Promyelocytes Relative PENDING %   Blasts PENDING %   No results found.  Pending Labs Unresulted Labs (From admission, onward)    Start     Ordered   12/27/18 0511  Phosphorus  Once,   STAT     12/27/18 0510   12/27/18 0511  Magnesium  Once,   STAT     12/27/18 0510   12/27/18 0511  Comprehensive metabolic panel  ONCE - STAT,   STAT     12/27/18 0510   12/27/18 0511  Urine rapid drug screen (hosp performed)  ONCE - STAT,   STAT     12/27/18 0510   12/27/18 0511  Ethanol/ETOH  ONCE - STAT,   STAT     12/27/18 0510   12/27/18 0511  Acetaminophen level  Once,   STAT     12/27/18 0510   12/27/18 0511  Salicylate level  Once,   STAT     12/27/18 0510          Vitals/Pain Today's Vitals   12/27/18 0430 12/27/18 0445 12/27/18 0530 12/27/18 0600  BP:  107/88 105/76 115/89 103/65  Pulse: 61 (!) 54    Resp: Temp:      TempSrc:      SpO2: 98% 97%    Weight:      Height:      PainSc:        Isolation Precautions No active isolations  Medications Medications  fosPHENYtoin (CEREBYX) 1,300 mg PE in sodium chloride 0.9 % 50 mL IVPB (0 mg PE Intravenous Stopped 12/27/18 0540)  LORazepam (ATIVAN) injection 1 mg (1 mg Intravenous Given 12/27/18 0532)  diphenhydrAMINE (BENADRYL) injection 25 mg (25 mg Intravenous Given 12/27/18 0544)    Mobility walks Moderate fall risk   Focused Assessments Neuro Assessment Handoff:  Swallow screen pass? Yes          Neuro Assessment:   Neuro Checks:      Last Documented NIHSS Modified Score:   Has TPA been given? No If patient is a Neuro Trauma and patient is going to OR before floor call report to 4N Charge nurse: [phone] or [phone]     R Recommendations: See Admitting Provider Note  Report given to:   Additional Notes: n/a

## 2018-12-27 NOTE — ED Triage Notes (Signed)
BIB EMS from home. Pt reports L leg pain since 12/22/18 after being seen here for a seizure. Requesting a work note.

## 2018-12-27 NOTE — Progress Notes (Signed)
Xray call and stated that since they tried to get the patient twice from ED the patient's doctor will need to call the radiologist. Dr Ophelia Charter paged through [facility]

## 2018-12-27 NOTE — Progress Notes (Signed)
The nurse called because the patient is reporting suicidal ideations.  She went to check on him and he was crying.  He is concerned he won't keep a job and does not have social support.  Voiced suicidal thoughts, frequent.  "The only way I can think of is I blow my mother-f*&*^ing brains out."  Has a nonspecific plan.  Will place on suicide precautions and request a psychiatry consult ASAP.   Georgana Curio, M.D.

## 2018-12-27 NOTE — Progress Notes (Signed)
Tele on and 2nd verified

## 2018-12-27 NOTE — Progress Notes (Signed)
EEG completed, results pending. 

## 2018-12-27 NOTE — Procedures (Signed)
History: 23 yo M being evaluated for left leg jerking  Sedation: Ativan given earlier in the day  Technique: This is a 21 channel routine scalp EEG performed at the bedside with bipolar and monopolar montages arranged in accordance to the international 10/20 system of electrode placement. One channel was dedicated to EKG recording.    Background: The majority of this EEG is performed during sleep, however during the brief episodes of waking, the background consists of intermixed alpha and beta activities. There is a well defined posterior dominant rhythm of 9 Hz that attenuates with eye opening. Sleep is recorded with normal appearing structures.   Photic stimulation: Physiologic driving is not performed  EEG Abnormalities: None  Clinical Interpretation: This normal EEG is recorded in the waking and sleep state. There was no seizure or seizure predisposition recorded on this study. Please note that lack of epileptiform activity on EEG does not preclude the possibility of epilepsy.   Ritta Slot, MD [facility] [phone]  If 7pm- 7am, please page neurology on call as listed in [facility].

## 2018-12-27 NOTE — Procedures (Signed)
  Procedure: LP under fluoro OP12cmH2O, 29ml clear colorless CSF collected EBL:   minimal Complications:  none immediate  See full dictation in [employer].  Thora Lance MD Main # [phone] Pager  [phone]

## 2018-12-27 NOTE — ED Notes (Signed)
bfast ordered 

## 2018-12-27 NOTE — Plan of Care (Signed)
Progressing

## 2018-12-27 NOTE — Consult Note (Addendum)
Requesting Physician: Frederik Pear PA-C, Dr. Daun Peacock    Chief Complaint: Left leg jerking  History obtained from: Patient, EDP and Chart   HPI:                                                                                                                                       Jeremy Lane is a 23 y.o. male with  past medical history of hypertension, heavy alcohol abuse, marijuana use presents to the ED with continuous seizure-like activity since 5 days.   Per description from the EDP, left leg had continuous rhythmic activity from hip downwards.  The patient states that this is been going on for about 5 days.  Patient was seen in the emergency department on 5/18  after presenting with possible seizure-like activity.  He has some left pain and tingling followed by jerking.  This happened just prior to him having to go to court.  Patient was discharged from the ED with plan to follow-up with neurologist.  Patient states that since his discharge he has been having continuous jerking of the left leg that has persisted throughout the day.  Patient was given 1 mg of Ativan and received a load of Dilantin.  On assessment, patient had  received Dilantin and his leg jerking had stopped.  Patient also started itching after receiving Dilantin.   Past Medical History:  Diagnosis Date  . Hypertension     History reviewed. No pertinent surgical history.  No family history on file. Social History:  reports that he has been smoking cigarettes. He has been smoking about 0.20 packs per day. He has never used smokeless tobacco. He reports current alcohol use. He reports current drug use. Drugs: Marijuana and Cocaine.  Allergies:  Allergies  Allergen Reactions  . Fosphenytoin Itching    Medications:                                                                                                                        I reviewed home medications   ROS:  14 systems reviewed and negative except above    Examination:                                                                                                      General: Appears well-developed and well-nourished.  Psych: Affect appropriate to situation Eyes: No scleral injection HENT: No OP obstrucion Head: Normocephalic.  Cardiovascular: Normal rate and regular rhythm.  Respiratory: Effort normal and breath sounds normal to anterior ascultation GI: Soft.  No distension. There is no tenderness.  Skin: WDI    Neurological Examination Mental Status: Alert, oriented, thought content appropriate.  Speech fluent without evidence of aphasia. Able to follow 3 step commands without difficulty. Cranial Nerves: II: Visual fields grossly normal,  III,IV, VI: ptosis not present, extra-ocular motions intact bilaterally, pupils equal, round, reactive to light and accommodation V,VII: smile symmetric, facial light touch sensation normal bilaterally VIII: hearing normal bilaterally IX,X: uvula rises symmetrically XI: bilateral shoulder shrug XII: midline tongue extension Motor: Right : Upper extremity   5/5    Left:                Upper extremity   5/5  Lower extremity   4+/5     Lower extremity   3/5 Tone and bulk:normal tone throughout; no atrophy noted Sensory: Pinprick and light touch intact throughout, bilaterally Deep Tendon Reflexes:  Plantars: Right: downgoing   Left: downgoing Cerebellar: normal finger-to-nose, normal rapid alternating movements and normal heel-to-shin test Gait: not assessed due to safety     Lab Results: Basic Metabolic Panel: Recent Labs  Lab 12/22/18 1058  NA 138  K 3.8  CL 103  CO2 25  GLUCOSE 85  BUN 8  CREATININE 0.46*  CALCIUM 9.1  MG 2.0  PHOS 2.7    CBC: Recent Labs  Lab 12/22/18 1058 12/27/18 0538  WBC 3.1* 3.2*  NEUTROABS 1.6* PENDING  HGB  13.0 12.4*  HCT 40.7 38.1*  MCV 84.8 84.9  PLT 160 176    Coagulation Studies: No results for input(s): LABPROT, INR in the last 72 hours.  Imaging: No results found.   I have reviewed the above imaging : CT head from 5/18 MRI Brain pending:    ASSESSMENT AND PLAN  23 y.o. male with  past medical history of hypertension, heavy alcohol abuse, marijuana use presents to the ED with continuous seizure-like activity of left leg jerking since 5 days.   Focal status epilepticus  -Resolved after administration of 20 mg/KG fosphenytoin -We will start Keppra 1 g twice daily for maintenance dosing as patient developed itching after receiving fosphenytoin -MRI brain with and without contrast in process -EEG today in the morning -Urine drug screen -Seizure precautions   [place], patients with seizures are not allowed to drive until they have been seizure-free for six months. Use caution when using heavy equipment or power tools. Avoid working on ladders or at heights. Take showers instead of baths. Ensure the water temperature is not too high on the home water heater. Do not go swimming alone. Do not  lock yourself in a room alone (i.e. bathroom). When caring for infants or small children, sit down when holding, feeding, or changing them to minimize risk of injury to the child in the event you have a seizure. Maintain good sleep hygiene. Avoid alcohol.    If Jeremy Lane has another seizure, call 911 and bring them back to the ED if:       A.  The seizure lasts longer than 5 minutes.            B.  The patient doesn't wake shortly after the seizure or has new problems such as difficulty seeing, speaking or moving following the seizure       C.  The patient was injured during the seizure       D.  The patient has a temperature over 102 F (39C)       E.  The patient vomited during the seizure and now is having trouble breathing   Addendum  Reviewed MRI brain,  shows small area of T2 flair signal intensity along the cortex in the posterior right parietal lobe, with some restriction diffusion without corresponding ADC correlate.  SWI images show no evidence of thrombus in the cortical veins and I see no abnormal contrast-enhancement.  This likely represents focus of seizure.  Formal neuroradiology read pending.  Patient presents focal status epilepticus and in the absence of a explanatory lesion, I do think the patient needs a lumbar puncture. We also need to check for HIV.  Urine drug screen is still pending.  Case signed out to Dr. Milon Dikes.   Jeremy Lane [facility] Pager Number [phone]

## 2018-12-28 ENCOUNTER — Other Ambulatory Visit: Payer: Self-pay

## 2018-12-28 DIAGNOSIS — F4329 Adjustment disorder with other symptoms: Secondary | ICD-10-CM | POA: Diagnosis present

## 2018-12-28 LAB — COMPREHENSIVE METABOLIC PANEL
ALT: 156 U/L — ABNORMAL HIGH (ref 0–44)
AST: 148 U/L — ABNORMAL HIGH (ref 15–41)
Albumin: 3.1 g/dL — ABNORMAL LOW (ref 3.5–5.0)
Alkaline Phosphatase: 36 U/L — ABNORMAL LOW (ref 38–126)
Anion gap: 7 (ref 5–15)
BUN: 9 mg/dL (ref 6–20)
CO2: 24 mmol/L (ref 22–32)
Calcium: 8.9 mg/dL (ref 8.9–10.3)
Chloride: 108 mmol/L (ref 98–111)
Creatinine, Ser: 0.46 mg/dL — ABNORMAL LOW (ref 0.61–1.24)
GFR calc Af Amer: >60 mL/min (ref >60)
GFR calc non Af Amer: >60 mL/min (ref >60)
Glucose, Bld: 91 mg/dL (ref 70–99)
Potassium: 3.9 mmol/L (ref 3.5–5.1)
Sodium: 139 mmol/L (ref 135–145)
Total Bilirubin: 0.6 mg/dL (ref 0.3–1.2)
Total Protein: 7.2 g/dL (ref 6.5–8.1)

## 2018-12-28 LAB — RPR: RPR Ser Ql: NONREACTIVE

## 2018-12-28 LAB — CBC
HCT: 38.7 % — ABNORMAL LOW (ref 39.0–52.0)
Hemoglobin: 12.5 g/dL — ABNORMAL LOW (ref 13.0–17.0)
MCH: 27.2 pg (ref 26.0–34.0)
MCHC: 32.3 g/dL (ref 30.0–36.0)
MCV: 84.3 fL (ref 80.0–100.0)
Platelets: 160 10*3/uL (ref 150–400)
RBC: 4.59 MIL/uL (ref 4.22–5.81)
RDW: 12.7 % (ref 11.5–15.5)
WBC: 3.4 10*3/uL — ABNORMAL LOW (ref 4.0–10.5)
nRBC: 0 % (ref 0.0–0.2)

## 2018-12-28 LAB — CK: Total CK: 9312 U/L — ABNORMAL HIGH (ref 49–397)

## 2018-12-28 MED ORDER — THIAMINE HCL 100 MG/ML IJ SOLN: 100.0000 mg | Freq: Every day | INTRAMUSCULAR | Status: DC

## 2018-12-28 MED ORDER — FOLIC ACID 1 MG PO TABS
1.0000 mg | ORAL_TABLET | Freq: Every day | ORAL | Status: DC
  Administered 2018-12-28 – 2019-01-03 (×7): 1 mg via ORAL
  Filled 2018-12-28 (×7): qty 1

## 2018-12-28 MED ORDER — LORAZEPAM 2 MG/ML IJ SOLN: 1.0000 mg | Freq: Four times a day (QID) | INTRAMUSCULAR | Status: AC | PRN

## 2018-12-28 MED ORDER — VITAMIN B-1 100 MG PO TABS
100.0000 mg | ORAL_TABLET | Freq: Every day | ORAL | Status: DC
  Administered 2018-12-28 – 2019-01-03 (×7): 100 mg via ORAL
  Filled 2018-12-28 (×7): qty 1

## 2018-12-28 MED ORDER — LORAZEPAM 1 MG PO TABS
1.0000 mg | ORAL_TABLET | Freq: Four times a day (QID) | ORAL | Status: AC | PRN
  Administered 2018-12-29 – 2018-12-30 (×3): 1 mg via ORAL
  Filled 2018-12-28 (×4): qty 1

## 2018-12-28 MED ORDER — ADULT MULTIVITAMIN W/MINERALS CH
1.0000 | ORAL_TABLET | Freq: Every day | ORAL | Status: DC
  Administered 2018-12-28 – 2019-01-02 (×7): 1 via ORAL
  Filled 2018-12-28 (×7): qty 1

## 2018-12-28 MED ORDER — LORAZEPAM 2 MG/ML IJ SOLN
1.0000 mg | Freq: Once | INTRAMUSCULAR | Status: AC
  Administered 2018-12-28: 1 mg via INTRAVENOUS
  Filled 2018-12-28 (×2): qty 1

## 2018-12-28 NOTE — Progress Notes (Addendum)
Neurology Progress Note   S:// Patient seen and examined. Comfortably sleeping in bed, awoke to voice, answers all questions appropriately and provided reliable history. Reports of left leg pain/cramping/discomfort for the past couple of weeks but the twitching as reported in the initial consultation.  No further episodes of twitching since yesterday. He has been on a one-to-one sitter for suicidal ideations.   O:// Current vital signs: BP 102/65 (BP Location: Right Arm)   Pulse 61   Temp 98 F (36.7 C) (Oral)   Resp 18   Ht 5\' 6"  (1.676 m)   Wt 64.9 kg   SpO2 100%   BMI 23.08 kg/m  Vital signs in last 24 hours: Temp:  [97.7 F (36.5 C)-98.2 F (36.8 C)] 98 F (36.7 C) (05/24 0800) Pulse Rate:  [56-78] 61 (05/24 0800) Resp:  [14-18] 18 (05/24 0800) BP: (102-128)/(65-89) 102/65 (05/24 0800) SpO2:  [98 %-100 %] 100 % (05/24 0800) General: Awake alert in no distress HEENT: Normocephalic atraumatic dry mucous membranes Lungs: Breathing normally and saturating well on room air Cardiovascular: Regular rate rhythm Abdomen: Nondistended nontender Extremities: Warm well perfused with no edema Neurological exam Patient is awake alert oriented x3. His speech is not dysarthric There is no evidence of aphasia Cranial nerves: Pupils equal round react light, extraocular movements intact, visual fields are full, face symmetric, facial sensation intact bilaterally, auditory acuity intact bilaterally Tongue is midline, shoulder shrug intact, palate midline. Motor exam: Right and left upper extremity 5/5.  Left lower extremity 4/5 with vertical drift.  Left upper extremity 5/5. Sensory exam: Decreased on the left leg Coordination: Intact  Medications  Current Facility-Administered Medications:  .  0.9 %  sodium chloride infusion, 75 mL/hr, Intravenous, Continuous, Jeremy Blue, MD, Last Rate: 75 mL/hr at 12/28/18 0029, 75 mL/hr at 12/28/18 0029 .  acetaminophen (TYLENOL) tablet 650  mg, 650 mg, Oral, Q4H PRN **OR** acetaminophen (TYLENOL) suppository 650 mg, 650 mg, Rectal, Q4H PRN, Jeremy Blue, MD .  docusate sodium (COLACE) capsule 100 mg, 100 mg, Oral, BID, Jeremy Blue, MD, 100 mg at 12/27/18 2215 .  levETIRAcetam (KEPPRA) IVPB 1000 mg/100 mL premix, 1,000 mg, Intravenous, Q12H, Jeremy Blue, MD, Last Rate: 400 mL/hr at 12/28/18 0754, 1,000 mg at 12/28/18 0754 .  LORazepam (ATIVAN) injection 1-2 mg, 1-2 mg, Intravenous, Q2H PRN, Jeremy Blue, MD .  ondansetron (ZOFRAN) tablet 4 mg, 4 mg, Oral, Q6H PRN **OR** ondansetron (ZOFRAN) injection 4 mg, 4 mg, Intravenous, Q6H PRN, Jeremy Blue, MD .  polyethylene glycol (MIRALAX / GLYCOLAX) packet 17 g, 17 g, Oral, Daily PRN, Jeremy Blue, MD Labs CBC    Component Value Date/Time   WBC 3.4 (L) 12/28/2018 0701   RBC 4.59 12/28/2018 0701   HGB 12.5 (L) 12/28/2018 0701   HCT 38.7 (L) 12/28/2018 0701   PLT 160 12/28/2018 0701   MCV 84.3 12/28/2018 0701   MCH 27.2 12/28/2018 0701   MCHC 32.3 12/28/2018 0701   RDW 12.7 12/28/2018 0701   LYMPHSABS 2.1 12/27/2018 0538   MONOABS 0.3 12/27/2018 0538   EOSABS 0.0 12/27/2018 0538   BASOSABS 0.0 12/27/2018 0538    CMP     Component Value Date/Time   NA 139 12/28/2018 0701   K 3.9 12/28/2018 0701   CL 108 12/28/2018 0701   CO2 24 12/28/2018 0701   GLUCOSE 91 12/28/2018 0701   BUN 9 12/28/2018 0701   CREATININE 0.46 (L) 12/28/2018 0701   CALCIUM 8.9 12/28/2018 0701   PROT 7.2 12/28/2018 0701  ALBUMIN 3.1 (L) 12/28/2018 0701   AST 148 (H) 12/28/2018 0701   ALT 156 (H) 12/28/2018 0701   ALKPHOS 36 (L) 12/28/2018 0701   BILITOT 0.6 12/28/2018 0701   GFRNONAA >60 12/28/2018 0701   GFRAA >60 12/28/2018 0701  Urinary drug screen positive for marijuana CSF analysis with glucose 54, 83 RBCs, 0 WBCs, protein 39. Imaging I have reviewed images in epic and the results pertinent to this consultation are: MRI of the brain reveals T2 signal abnormality in the  high right parietal lobe with no evidence of restricted diffusion or postcontrast enhancement.  Potential differential diagnoses include post ictal phenomenology versus a low-grade tumor.  Radiology recommends a short follow-up in 2 to 3 months.  EEG normal  Assessment: [name] is a 23 year old man who has a past medical history of hypertension, excessive alcohol abuse, and marijuana abuse who presented to the emergency room with continuous rhythmic jerking of his left leg for 5 days prior to presentation. He was evaluated by neurology in the emergency room for possible focal status epilepticus and treated with a load of fosphenytoin followed by maintenance dose of Keppra 1 g twice daily.  His twitching leg twitching/jerking has resolved but he continues to be weak on the left leg. His brain imaging is perplexing with a T2 signal abnormality without restricted diffusion or postcontrast enhancement in the high right parietal lobe suggestive of possible postictal phenomenology versus a low-grade tumor. CSF analysis is bland and not suggestive of any infection.  0 white cells. I do not have a very clear explanation of why he would have a sudden onset of seizures and am going to recommend to follow him outpatient with follow-up imaging in about 4 weeks to assess the area in the right parietal lobe that is abnormal.  Impression: Focal status epilepticus resolved Brain MRI abnormality in the right temporal lobe-seizure edema versus low-grade tumor  Recommendations: Continue Keppra 1 g twice daily Brain MRI without contrast in 4 weeks-should be done prior to the outpatient neurology appointment Follow-up with outpatient neurology in 4 to 6 weeks Although the imaging findings are not suggestive of entities such as HIV encephalitis, it it will be prudent to check his HIV status.  I have ordered the HIV test.  Primary team to follow. Seizure precautions as listed in detail by Dr. Laurence Lane in his  consultation  -- Jeremy DikesAshish Faraz Ponciano, MD [facility] Pager: [phone] If 7pm to 7am, please call on call as listed on AMION.  SEIZURE PRECAUTIONS Per [facility]  [place] statutes, patients with seizures are not allowed to drive until they have been seizure-free for six months.   Use caution when using heavy equipment or power tools. Avoid working on ladders or at heights. Take showers instead of baths. Ensure the water temperature is not too high on the home water heater. Do not go swimming alone. Do not lock yourself in a room alone (i.e. bathroom). When caring for infants or small children, sit down when holding, feeding, or changing them to minimize risk of injury to the child in the event you have a seizure. Maintain good sleep hygiene. Avoid alcohol.   If patient has another seizure, call 911 and bring them back to the ED if: A. The seizure lasts longer than 5 minutes.  B. The patient doesn't wake shortly after the seizure or has new problems such as difficulty seeing, speaking or moving following the seizure C. The patient was injured during the seizure D. The patient has a  temperature over 102 F (39C) E. The patient vomited during the seizure and now is having trouble breathing

## 2018-12-28 NOTE — Progress Notes (Signed)
Pt with suicide watch, very emotional and tearful that no one( family ) cares about him,  And sad stating he could not sleep RN gave emotional   Medical person oncall made aware. One time 1 mg ativan given for anxiety and sleep with some effect noted, pt resting quietly in bed now.remained on suicide watch,

## 2018-12-28 NOTE — Plan of Care (Signed)
  Problem: Coping: Goal: Ability to identify appropriate support needs will improve Outcome: Not Applicable   Problem: Coping: Goal: Ability to identify appropriate support needs will improve Outcome: Not Applicable   Problem: Health Behavior/Discharge Planning: Goal: Compliance with prescribed medication regimen will improve Outcome: Not Applicable   Problem: Medication: Goal: Risk for medication side effects will decrease Outcome: Not Applicable   Problem: Clinical Measurements: Goal: Complications related to the disease process, condition or treatment will be avoided or minimized Outcome: Not Applicable Goal: Diagnostic test results will improve Outcome: Not Applicable   Problem: Safety: Goal: Verbalization of understanding the information provided will improve Outcome: Not Applicable   Problem: Self-Concept: Goal: Level of anxiety will decrease Outcome: Not Applicable Goal: Ability to verbalize feelings about condition will improve Outcome: Not Applicable   Problem: Education: Goal: Expressions of having a comfortable level of knowledge regarding the disease process will increase Outcome: Adequate for Discharge   Problem: Education: Goal: Expressions of having a comfortable level of knowledge regarding the disease process will increase Outcome: Adequate for Discharge

## 2018-12-28 NOTE — Progress Notes (Signed)
PROGRESS NOTE    Jeremy Lane  EAV:[id number] DOB: 08/19/1995 DOA: 12/27/2018 PCP: Patient, No Pcp Per    Brief Narrative:  Jeremy Lane is a 23 y.o. male with past medical history of hypertension, heavy alcohol abuse, marijuana use presents to the ED with continuous seizure-like activity since 5 days.  He was admitted for evaluation of seizures.   Assessment & Plan:   Principal Problem:   Seizure (HCC) Active Problems:   Essential hypertension   Elevated LFTs   Polysubstance abuse (HCC)   Adjustment disorder with disturbance of emotion   Adjustment disorder with disturbance of emotion   Seizures:  Differential include ? Alcohol induced vs HIV encephalitis.  Pt reports his twitching improved.  He was started on keppra 1 g BID . Neurology consulted and recommendations given.  MRI brain reviewed.  Neurology recommends another MRI in 4 weeks and outpatient neurology follow up.  HIV test pending  Seizures precautions.     Essential hypertension;  Well controlled.    Elevated liver function tests: Suspect from alcohol use.  Get US liver for further evaluation.     Alcohol abuse:  Pt restless , reports cramping leg pain.  CK levels ordered.  CIWA protocol.   Pt initially verbalized suicidal thoughts earlier today.  Psychiatry consulted.       DVT prophylaxis:lovenox.  Code Status: full code.  Family Communication: discussed with aunt over the phone.  Disposition Plan: pending clinical .   Consultants:   Neurology  Psychiatry.    Procedures: MRI brain  EEG  US abdomen / liver.   Antimicrobials: none.   Subjective: No chest pain, some discomfort in the abdomen, no appetite, mild headache,  Leg cramps.   Objective: Vitals:   12/28/18 0500 12/28/18 0800 12/28/18 1151 12/28/18 1557  BP: 128/86 102/65 117/88 123/69  Pulse: 78 61 60 67  Resp: 16 18 18 18   Temp: 98.2 F (36.8 C) 98 F (36.7 C) 98 F (36.7 C) 98 F (36.7 C)   TempSrc: Oral Oral Oral Oral  SpO2: 100% 100% 100% 99%  Weight:      Height:        Intake/Output Summary (Last 24 hours) at 12/28/2018 1714 Last data filed at 12/28/2018 1500 Gross per 24 hour  Intake 2000.33 ml  Output 1880 ml  Net 120.33 ml   Filed Weights   12/27/18 0228  Weight: 64.9 kg    Examination:  General exam: Appears calm and comfortable  Respiratory system: Clear to auscultation. Respiratory effort normal. Cardiovascular system: S1 & S2 heard, RRR. Marland Kitchen. No pedal edema. Gastrointestinal system: Abdomen is soft non distended, bowel sounds good.  Central nervous system: Alert and oriented. No focal neurological deficits. Extremities: Symmetric 5 x 5 power. Skin: No rashes, lesions or ulcers Psychiatry: restless , depressed.     Data Reviewed: I have personally reviewed following labs and imaging studies  CBC: Recent Labs  Lab 12/22/18 1058 12/27/18 0538 12/28/18 0701  WBC 3.1* 3.2* 3.4*  NEUTROABS 1.6* 0.8*  --   HGB 13.0 12.4* 12.5*  HCT 40.7 38.1* 38.7*  MCV 84.8 84.9 84.3  PLT 160 176 160   Basic Metabolic Panel: Recent Labs  Lab 12/22/18 1058 12/27/18 0538 12/28/18 0701  NA 138 136 139  K 3.8 3.8 3.9  CL 103 101 108  CO2 25 26 24   GLUCOSE 85 85 91  BUN 8 9 9   CREATININE 0.46* 0.42* 0.46*  CALCIUM 9.1 9.4 8.9  MG 2.0 1.9  --  PHOS 2.7 4.5  --    GFR: Estimated Creatinine Clearance: 129.6 mL/min (A) (by C-G formula based on SCr of 0.46 mg/dL (L)). Liver Function Tests: Recent Labs  Lab 12/22/18 1058 12/27/18 0538 12/28/18 0701  AST 231* 199* 148*  ALT 203* 187* 156*  ALKPHOS 35* 39 36*  BILITOT 0.5 0.6 0.6  PROT 7.7 7.5 7.2  ALBUMIN 3.5 3.5 3.1*   No results for input(s): LIPASE, AMYLASE in the last 168 hours. No results for input(s): AMMONIA in the last 168 hours. Coagulation Profile: No results for input(s): INR, PROTIME in the last 168 hours. Cardiac Enzymes: No results for input(s): CKTOTAL, CKMB, CKMBINDEX, TROPONINI in  the last 168 hours. BNP (last 3 results) No results for input(s): PROBNP in the last 8760 hours. HbA1C: No results for input(s): HGBA1C in the last 72 hours. CBG: Recent Labs  Lab 12/22/18 1104 12/27/18 0518  GLUCAP 76 89   Lipid Profile: No results for input(s): CHOL, HDL, LDLCALC, TRIG, CHOLHDL, LDLDIRECT in the last 72 hours. Thyroid Function Tests: No results for input(s): TSH, T4TOTAL, FREET4, T3FREE, THYROIDAB in the last 72 hours. Anemia Panel: No results for input(s): VITAMINB12, FOLATE, FERRITIN, TIBC, IRON, RETICCTPCT in the last 72 hours. Sepsis Labs: No results for input(s): PROCALCITON, LATICACIDVEN in the last 168 hours.  Recent Results (from the past 240 hour(s))  SARS Coronavirus 2 (CEPHEID - Performed in [facility] hospital lab), Hosp Order     Status: None   Collection Time: 12/27/18  8:03 AM  Result Value Ref Range Status   SARS Coronavirus 2 NEGATIVE NEGATIVE Final    Comment: (NOTE) If result is NEGATIVE SARS-CoV-2 target nucleic acids are NOT DETECTED. The SARS-CoV-2 RNA is generally detectable in upper and lower  respiratory specimens during the acute phase of infection. The lowest  concentration of SARS-CoV-2 viral copies this assay can detect is 250  copies / mL. A negative result does not preclude SARS-CoV-2 infection  and should not be used as the sole basis for treatment or other  patient management decisions.  A negative result may occur with  improper specimen collection / handling, submission of specimen other  than nasopharyngeal swab, presence of viral mutation(s) within the  areas targeted by this assay, and inadequate number of viral copies  (<250 copies / mL). A negative result must be combined with clinical  observations, patient history, and epidemiological information. If result is POSITIVE SARS-CoV-2 target nucleic acids are DETECTED. The SARS-CoV-2 RNA is generally detectable in upper and lower  respiratory specimens dur ing the  acute phase of infection.  Positive  results are indicative of active infection with SARS-CoV-2.  Clinical  correlation with patient history and other diagnostic information is  necessary to determine patient infection status.  Positive results do  not rule out bacterial infection or co-infection with other viruses. If result is PRESUMPTIVE POSTIVE SARS-CoV-2 nucleic acids MAY BE PRESENT.   A presumptive positive result was obtained on the submitted specimen  and confirmed on repeat testing.  While 2019 novel coronavirus  (SARS-CoV-2) nucleic acids may be present in the submitted sample  additional confirmatory testing may be necessary for epidemiological  and / or clinical management purposes  to differentiate between  SARS-CoV-2 and other Sarbecovirus currently known to infect humans.  If clinically indicated additional testing with an alternate test  methodology [phone]) is advised. The SARS-CoV-2 RNA is generally  detectable in upper and lower respiratory sp ecimens during the acute  phase of infection. The  expected result is Negative. Fact Sheet for Patients:  BoilerBrush.com.cy Fact Sheet for Healthcare Providers: https://pope.com/ This test is not yet approved or cleared by the [place] FDA and has been authorized for detection and/or diagnosis of SARS-CoV-2 by FDA under an Emergency Use Authorization (EUA).  This EUA will remain in effect (meaning this test can be used) for the duration of the COVID-19 declaration under Section 564(b)(1) of the Act, 21 U.S.C. section 360bbb-3(b)(1), unless the authorization is terminated or revoked sooner. Performed at [facility], [address] [place], [place] [address]   CSF culture     Status: None (Preliminary result)   Collection Time: 12/27/18  1:45 PM  Result Value Ref Range Status   Specimen Description CSF  Final   Special Requests NONE  Final   Gram Stain NO WBC SEEN  NO ORGANISMS SEEN CYTOSPIN SMEAR   Final   Culture   Final    NO GROWTH < 24 HOURS Performed at [facility], [address], [place], [place] [address]    Report Status PENDING  Incomplete         Radiology Studies: Mr Laqueta Jean And Wo Contrast  Result Date: 12/27/2018 CLINICAL DATA:  Seizure 12/22/2018.  Left leg pain. EXAM: MRI HEAD WITHOUT AND WITH CONTRAST TECHNIQUE: Multiplanar, multiecho pulse sequences of the brain and surrounding structures were obtained without and with intravenous contrast. CONTRAST:  6 mL Gadovist IV COMPARISON:  CT head 12/22/2018 FINDINGS: Brain: Cortical edema in the high right parietal lobe with relative sparing of the white matter. No associated restricted diffusion or enhancement. This area measures approximately 2.5 cm in diameter. No other areas of cortical edema. Normal white matter. Negative for acute infarct. No demyelinating disease or hemorrhage. Normal enhancement postcontrast administration. Vascular: Normal arterial flow voids. Normal venous sinus enhancement. Skull and upper cervical spine: Negative Sinuses/Orbits: Prominent hypertrophy of the adenoid tissue. Mucosal edema paranasal sinuses. Negative orbit. Mastoid clear bilaterally Other: None IMPRESSION: Cortical edema high right parietal lobe without restricted diffusion or enhancement. This is likely related to the patient's recent seizure activity. Differential diagnosis includes encephalitis and low-grade tumor. Follow-up MRI brain without with contrast recommended 2-3 months to evaluate for resolution. Electronically Signed   By: Marlan Palau M.D.   On: 12/27/2018 07:32   Dg Fluoro Guide Lumbar Puncture  Result Date: 12/27/2018 CLINICAL DATA:  Seizure.  Abnormal brain MRI. EXAM: LUMBAR PUNCTURE UNDER FLUOROSCOPY FLUOROSCOPY TIME:  0.1 minute; 15 uGym2 DAP TECHNIQUE: The procedure, risks (including but not limited to bleeding, infection, organ damage ), benefits, and alternatives were  explained to the patient. Questions regarding the procedure were encouraged and answered. The patient understands and consents to the procedure. An appropriate skin entry site was determined fluoroscopically. Operator donned sterile gloves and mask. Skin site was marked, then prepped with Betadine, draped in usual sterile fashion, and infiltrated locally with 1% lidocaine. A 20 gauge spinal needle advanced into the thecal sac at L3-4 from a right interlaminar approach. Clear colorless CSF spontaneously returned, with opening pressure of 12 cm water. 9ml CSF were collected and divided among 4 sterile vials for the requested laboratory studies. The needle was then removed. COMPLICATIONS: None immediate IMPRESSION: 1. Technically successful lumbar puncture under fluoroscopy. Electronically Signed   By: Corlis Leak M.D.   On: 12/27/2018 14:02        Scheduled Meds: . docusate sodium  100 mg Oral BID  . folic acid  1 mg Oral Daily  .  multivitamin with minerals  1 tablet Oral Daily  . thiamine  100 mg Oral Daily   Or  . thiamine  100 mg Intravenous Daily   Continuous Infusions: . sodium chloride 75 mL/hr (12/28/18 0029)  . levETIRAcetam 1,000 mg (12/28/18 0754)     LOS: 0 days    Time spent: 38 minutes.     Kathlen Mody, MD [facility] Pager [phone]   If 7PM-7AM, please contact night-coverage [email or web address] Password [facility] 12/28/2018, 5:14 PM

## 2018-12-28 NOTE — Consult Note (Signed)
[facility] Face-to-Face Psychiatry Consult   Reason for Consult:  Suicidal statement when he was upset Referring Physician:  Dr Blake Divine Patient Identification: Jeremy Lane MRN:  [id number] Principal Diagnosis: Seizure [facility]) Diagnosis:  Principal Problem:   Seizure (HCC) Active Problems:   Essential hypertension   Elevated LFTs   Polysubstance abuse (HCC)   Total Time spent with patient: 45 minutes  Subjective:   Jeremy Lane is a 23 y.o. male patient admitted with seizures.  On assessment, denies suicidal/homicidal ideations, hallucinations, and recent substance abuse.  Stable at this time, sitter can be discontinued, resources for outpatient mental health services at discharge (please have the social worker provide these).  Dr Lucianne Muss consulted and concurs with this plan.  HPI:  23 yo male who presented to the ED with seizure issues.  During his stay, he became frustrated and stated he wanted to kill himself but was upset at the time.  He no longer feels this way.  He reports seeing a psychiatrist in [place] prior to moving to [facility] for bipolar d/o and borderline personality, denies taking any psychiatric medications.  Denies past suicide attempts.  Reports "I had an alcohol problem but stopped that very quickly."  Prior to assessment, patient was calmly listening to music on his phone, denied needing anything from mental health provider.    Past Psychiatric History: Reports borderline personality and bipolar disorder.  Risk to Self:  none Risk to Others:  none Prior Inpatient Therapy:  denies Prior Outpatient Therapy:   s  Past Medical History:  Past Medical History:  Diagnosis Date  . Hypertension    History reviewed. No pertinent surgical history. Family History: No family history on file. Family Psychiatric  History: none Social History:  Social History   Substance and Sexual Activity  Alcohol Use Yes   Comment: states he has been drinking more lately because he is stressed      Social History   Substance and Sexual Activity  Drug Use Yes  . Types: Marijuana, Cocaine   Comment: cocaine "once in a blue moon"    Social History   Socioeconomic History  . Marital status: Single    Spouse name: Not on file  . Number of children: Not on file  . Years of education: Not on file  . Highest education level: Not on file  Occupational History  . Not on file  Social Needs  . Financial resource strain: Not on file  . Food insecurity:    Worry: Not on file    Inability: Not on file  . Transportation needs:    Medical: Not on file    Non-medical: Not on file  Tobacco Use  . Smoking status: Current Every Day Smoker    Packs/day: 0.20    Types: Cigarettes  . Smokeless tobacco: Never Used  Substance and Sexual Activity  . Alcohol use: Yes    Comment: states he has been drinking more lately because he is stressed  . Drug use: Yes    Types: Marijuana, Cocaine    Comment: cocaine "once in a blue moon"  . Sexual activity: Not on file  Lifestyle  . Physical activity:    Days per week: Not on file    Minutes per session: Not on file  . Stress: Not on file  Relationships  . Social connections:    Talks on phone: Not on file    Gets together: Not on file    Attends religious service: Not on file  Active member of club or organization: Not on file    Attends meetings of clubs or organizations: Not on file    Relationship status: Not on file  Other Topics Concern  . Not on file  Social History Narrative  . Not on file   Additional Social History:    Allergies:   Allergies  Allergen Reactions  . Fosphenytoin Itching    Labs:  Results for orders placed or performed during the hospital encounter of 12/27/18 (from the past 48 hour(s))  CBG monitoring, ED     Status: None   Collection Time: 12/27/18  5:18 AM  Result Value Ref Range   Glucose-Capillary 89 70 - 99 mg/dL  CBC WITH DIFFERENTIAL     Status: Abnormal   Collection Time: 12/27/18  5:38 AM   Result Value Ref Range   WBC 3.2 (L) 4.0 - 10.5 K/uL   RBC 4.49 4.22 - 5.81 MIL/uL   Hemoglobin 12.4 (L) 13.0 - 17.0 g/dL   HCT 01.0 (L) 27.2 - 53.6 %   MCV 84.9 80.0 - 100.0 fL   MCH 27.6 26.0 - 34.0 pg   MCHC 32.5 30.0 - 36.0 g/dL   RDW 64.4 03.4 - 74.2 %   Platelets 176 150 - 400 K/uL   nRBC 0.0 0.0 - 0.2 %   Neutrophils Relative % 25 %   Neutro Abs 0.8 (L) 1.7 - 7.7 K/uL   Lymphocytes Relative 65 %   Lymphs Abs 2.1 0.7 - 4.0 K/uL   Monocytes Relative 9 %   Monocytes Absolute 0.3 0.1 - 1.0 K/uL   Eosinophils Relative 1 %   Eosinophils Absolute 0.0 0.0 - 0.5 K/uL   Basophils Relative 0 %   Basophils Absolute 0.0 0.0 - 0.1 K/uL   Smear Review PLATELETS APPEAR ADEQUATE    Immature Granulocytes 0 %   Abs Immature Granulocytes 0.01 0.00 - 0.07 K/uL    Comment: Performed at [facility], [address] [place], [place] [address]  Phosphorus     Status: None   Collection Time: 12/27/18  5:38 AM  Result Value Ref Range   Phosphorus 4.5 2.5 - 4.6 mg/dL    Comment: Performed at [facility], [address], [place], [place] [address]  Magnesium     Status: None   Collection Time: 12/27/18  5:38 AM  Result Value Ref Range   Magnesium 1.9 1.7 - 2.4 mg/dL    Comment: Performed at [facility], [address], [place], [place] [address]  Comprehensive metabolic panel     Status: Abnormal   Collection Time: 12/27/18  5:38 AM  Result Value Ref Range   Sodium 136 135 - 145 mmol/L   Potassium 3.8 3.5 - 5.1 mmol/L   Chloride 101 98 - 111 mmol/L   CO2 26 22 - 32 mmol/L   Glucose, Bld 85 70 - 99 mg/dL   BUN 9 6 - 20 mg/dL   Creatinine, Ser 1.88 (L) 0.61 - 1.24 mg/dL   Calcium 9.4 8.9 - 41.6 mg/dL   Total Protein 7.5 6.5 - 8.1 g/dL   Albumin 3.5 3.5 - 5.0 g/dL   AST 606 (H) 15 - 41 U/L   ALT 187 (H) 0 - 44 U/L   Alkaline Phosphatase 39 38 - 126 U/L   Total Bilirubin 0.6 0.3 - 1.2 mg/dL   GFR calc non Af Amer >60 >60 mL/min   GFR calc Af Amer >60 >60 mL/min  Anion gap 9 5 - 15    Comment: Performed at [facility], [address], [place], [place] [address]  Ethanol/ETOH     Status: None   Collection Time: 12/27/18  5:38 AM  Result Value Ref Range   Alcohol, Ethyl (B) <10 <10 mg/dL    Comment: (NOTE) Lowest detectable limit for serum alcohol is 10 mg/dL. For medical purposes only. Performed at [facility], [address], [place], [place] [address]   Acetaminophen level     Status: Abnormal   Collection Time: 12/27/18  5:38 AM  Result Value Ref Range   Acetaminophen (Tylenol), Serum <10 (L) 10 - 30 ug/mL    Comment: (NOTE) Therapeutic concentrations vary significantly. A range of 10-30 ug/mL  may be an effective concentration for many patients. However, some  are best treated at concentrations outside of this range. Acetaminophen concentrations >150 ug/mL at 4 hours after ingestion  and >50 ug/mL at 12 hours after ingestion are often associated with  toxic reactions. Performed at [facility], [address], [place], [place] [address]   Salicylate level     Status: None   Collection Time: 12/27/18  5:38 AM  Result Value Ref Range   Salicylate Lvl <7.0 2.8 - 30.0 mg/dL    Comment: Performed at [facility], [address] [place], [place] [address]  Urine rapid drug screen (hosp performed)     Status: Abnormal   Collection Time: 12/27/18  8:03 AM  Result Value Ref Range   Opiates NONE DETECTED NONE DETECTED   Cocaine NONE DETECTED NONE DETECTED   Benzodiazepines NONE DETECTED NONE DETECTED   Amphetamines NONE DETECTED NONE DETECTED   Tetrahydrocannabinol POSITIVE (A) NONE DETECTED   Barbiturates NONE DETECTED NONE DETECTED    Comment: (NOTE) DRUG SCREEN FOR MEDICAL PURPOSES ONLY.  IF CONFIRMATION IS NEEDED FOR ANY PURPOSE, NOTIFY LAB WITHIN 5 DAYS. LOWEST DETECTABLE LIMITS FOR URINE DRUG SCREEN Drug Class                     Cutoff (ng/mL) Amphetamine and metabolites    1000 Barbiturate and  metabolites    200 Benzodiazepine                 200 Tricyclics and metabolites     300 Opiates and metabolites        300 Cocaine and metabolites        300 THC                            50 Performed at [facility], [address], [place], [place] [address]   SARS Coronavirus 2 (CEPHEID - Performed in [facility] hospital lab), Hosp Order     Status: None   Collection Time: 12/27/18  8:03 AM  Result Value Ref Range   SARS Coronavirus 2 NEGATIVE NEGATIVE    Comment: (NOTE) If result is NEGATIVE SARS-CoV-2 target nucleic acids are NOT DETECTED. The SARS-CoV-2 RNA is generally detectable in upper and lower  respiratory specimens during the acute phase of infection. The lowest  concentration of SARS-CoV-2 viral copies this assay can detect is 250  copies / mL. A negative result does not preclude SARS-CoV-2 infection  and should not be used as the sole basis for treatment or other  patient management decisions.  A negative result may occur with  improper specimen collection / handling,  submission of specimen other  than nasopharyngeal swab, presence of viral mutation(s) within the  areas targeted by this assay, and inadequate number of viral copies  (<250 copies / mL). A negative result must be combined with clinical  observations, patient history, and epidemiological information. If result is POSITIVE SARS-CoV-2 target nucleic acids are DETECTED. The SARS-CoV-2 RNA is generally detectable in upper and lower  respiratory specimens dur ing the acute phase of infection.  Positive  results are indicative of active infection with SARS-CoV-2.  Clinical  correlation with patient history and other diagnostic information is  necessary to determine patient infection status.  Positive results do  not rule out bacterial infection or co-infection with other viruses. If result is PRESUMPTIVE POSTIVE SARS-CoV-2 nucleic acids MAY BE PRESENT.   A presumptive positive result was obtained  on the submitted specimen  and confirmed on repeat testing.  While 2019 novel coronavirus  (SARS-CoV-2) nucleic acids may be present in the submitted sample  additional confirmatory testing may be necessary for epidemiological  and / or clinical management purposes  to differentiate between  SARS-CoV-2 and other Sarbecovirus currently known to infect humans.  If clinically indicated additional testing with an alternate test  methodology [phone]) is advised. The SARS-CoV-2 RNA is generally  detectable in upper and lower respiratory sp ecimens during the acute  phase of infection. The expected result is Negative. Fact Sheet for Patients:  BoilerBrush.com.cy Fact Sheet for Healthcare Providers: https://pope.com/ This test is not yet approved or cleared by the [place] FDA and has been authorized for detection and/or diagnosis of SARS-CoV-2 by FDA under an Emergency Use Authorization (EUA).  This EUA will remain in effect (meaning this test can be used) for the duration of the COVID-19 declaration under Section 564(b)(1) of the Act, 21 U.S.C. section 360bbb-3(b)(1), unless the authorization is terminated or revoked sooner. Performed at [facility], [address], [place], [place] [address]   CSF cell count with differential     Status: Abnormal   Collection Time: 12/27/18  1:45 PM  Result Value Ref Range   Tube # 3    Color, CSF COLORLESS COLORLESS   Appearance, CSF CLEAR (A) CLEAR   Supernatant CLEAR    RBC Count, CSF 83 (H) 0 /cu mm   WBC, CSF 0 0 - 5 /cu mm   Segmented Neutrophils-CSF TOO FEW TO COUNT, SMEAR AVAILABLE FOR REVIEW 0 - 6 %   Lymphs, CSF TOO FEW TO COUNT, SMEAR AVAILABLE FOR REVIEW 40 - 80 %   Monocyte-Macrophage-Spinal Fluid TOO FEW TO COUNT, SMEAR AVAILABLE FOR REVIEW 15 - 45 %   Other Cells, CSF ONLY OBSERVED LYMPHOCYTES ON MICROSCOPIC     Comment: Performed at [facility], [address]  [place], [place] [address]  Protein and glucose, CSF     Status: None   Collection Time: 12/27/18  1:45 PM  Result Value Ref Range   Glucose, CSF 53 40 - 70 mg/dL   Total  Protein, CSF 27 15 - 45 mg/dL    Comment: Performed at [facility], [address], [place], [place] [address]  Glucose, CSF     Status: None   Collection Time: 12/27/18  1:45 PM  Result Value Ref Range   Glucose, CSF 54 40 - 70 mg/dL    Comment: Performed at [facility], [address], [place], [place] [address]  Protein, CSF     Status: None   Collection  Time: 12/27/18  1:45 PM  Result Value Ref Range   Total  Protein, CSF 39 15 - 45 mg/dL    Comment: Performed at [facility], [address] [place], [place] [address]  CSF culture     Status: None (Preliminary result)   Collection Time: 12/27/18  1:45 PM  Result Value Ref Range   Specimen Description CSF    Special Requests NONE    Gram Stain NO WBC SEEN NO ORGANISMS SEEN CYTOSPIN SMEAR     Culture      NO GROWTH < 24 HOURS Performed at [facility], [address], [place], [place] [address]    Report Status PENDING   Comprehensive metabolic panel     Status: Abnormal   Collection Time: 12/28/18  7:01 AM  Result Value Ref Range   Sodium 139 135 - 145 mmol/L   Potassium 3.9 3.5 - 5.1 mmol/L   Chloride 108 98 - 111 mmol/L   CO2 24 22 - 32 mmol/L   Glucose, Bld 91 70 - 99 mg/dL   BUN 9 6 - 20 mg/dL   Creatinine, Ser 4.78 (L) 0.61 - 1.24 mg/dL   Calcium 8.9 8.9 - 29.5 mg/dL   Total Protein 7.2 6.5 - 8.1 g/dL   Albumin 3.1 (L) 3.5 - 5.0 g/dL   AST 621 (H) 15 - 41 U/L   ALT 156 (H) 0 - 44 U/L   Alkaline Phosphatase 36 (L) 38 - 126 U/L   Total Bilirubin 0.6 0.3 - 1.2 mg/dL   GFR calc non Af Amer >60 >60 mL/min   GFR calc Af Amer >60 >60 mL/min   Anion gap 7 5 - 15    Comment: Performed at [facility], [address] [place], [place] [address]  CBC     Status: Abnormal   Collection Time: 12/28/18  7:01 AM  Result Value  Ref Range   WBC 3.4 (L) 4.0 - 10.5 K/uL   RBC 4.59 4.22 - 5.81 MIL/uL   Hemoglobin 12.5 (L) 13.0 - 17.0 g/dL   HCT 78.4 (L) 69.6 - 29.5 %   MCV 84.3 80.0 - 100.0 fL   MCH 27.2 26.0 - 34.0 pg   MCHC 32.3 30.0 - 36.0 g/dL   RDW 28.4 13.2 - 44.0 %   Platelets 160 150 - 400 K/uL   nRBC 0.0 0.0 - 0.2 %    Comment: Performed at [facility], [address], [place], [place] [address]    Current Facility-Administered Medications  Medication Dose Route Frequency Provider Last Rate Last Dose  . 0.9 %  sodium chloride infusion  75 mL/hr Intravenous Continuous Jonah Blue, MD 75 mL/hr at 12/28/18 0029 75 mL/hr at 12/28/18 0029  . acetaminophen (TYLENOL) tablet 650 mg  650 mg Oral Q4H PRN Jonah Blue, MD       Or  . acetaminophen (TYLENOL) suppository 650 mg  650 mg Rectal Q4H PRN Jonah Blue, MD      . docusate sodium (COLACE) capsule 100 mg  100 mg Oral BID Jonah Blue, MD   100 mg at 12/27/18 2215  . levETIRAcetam (KEPPRA) IVPB 1000 mg/100 mL premix  1,000 mg Intravenous Steva Colder, MD 400 mL/hr at 12/28/18 0754 1,000 mg at 12/28/18 0754  . LORazepam (ATIVAN) injection 1-2 mg  1-2 mg Intravenous Q2H PRN Jonah Blue, MD      . ondansetron [facility]) tablet 4 mg  4 mg Oral Q6H PRN  Jonah BlueYates, Jennifer, MD       Or  . ondansetron [facility](ZOFRAN) injection 4 mg  4 mg Intravenous Q6H PRN Jonah BlueYates, Jennifer, MD      . polyethylene glycol (MIRALAX / GLYCOLAX) packet 17 g  17 g Oral Daily PRN Jonah BlueYates, Jennifer, MD        Musculoskeletal: Strength & Muscle Tone: within normal limits Gait & Station: did not witness Patient leans: N/A  Psychiatric Specialty Exam: Physical Exam  Nursing note and vitals reviewed. Constitutional: He is oriented to person, place, and time. He appears well-developed and well-nourished.  HENT:  Head: Normocephalic.  Neck: Normal range of motion.  Respiratory: Effort normal.  Musculoskeletal: Normal range of motion.  Neurological: He is alert and  oriented to person, place, and time.  Psychiatric: He has a normal mood and affect. His speech is normal and behavior is normal. Judgment and thought content normal. Cognition and memory are normal.    Review of Systems  All other systems reviewed and are negative.   Blood pressure (P) 117/88, pulse (P) 60, temperature (P) 98 F (36.7 C), temperature source (P) Oral, resp. rate (P) 18, height 5\' 6"  (1.676 m), weight 64.9 kg, SpO2 (P) 100 %.Body mass index is 23.08 kg/m.  General Appearance: Casual  Eye Contact:  Good  Speech:  Clear and Coherent  Volume:  Normal  Mood:  Euthymic  Affect:  Congruent  Thought Process:  Coherent  Orientation:  Full (Time, Place, and Person)  Thought Content:  WDL and Logical  Suicidal Thoughts:  No  Homicidal Thoughts:  No  Memory:  Immediate;   Good Recent;   Good Remote;   Good  Judgement:  Good  Insight:  Good  Psychomotor Activity:  Decreased  Concentration:  Concentration: Good and Attention Span: Good  Recall:  Good  Fund of Knowledge:  Good  Language:  Good  Akathisia:  No  Handed:  Right  AIMS (if indicated):     Assets:  Leisure Time Physical Health Resilience  ADL's:  Intact  Cognition:  WNL  Sleep:        Treatment Plan Summary: Daily contact with patient to assess and evaluate symptoms and progress in treatment, Medication management and Plan adjustment disorder with disturbance of emotions- -Request social worker or case manager to provide resources to [facility] for mental health services  Disposition: No evidence of imminent risk to self or others at present.    Nanine MeansJamison Lord, NP 12/28/2018 3:04 PM

## 2018-12-29 ENCOUNTER — Inpatient Hospital Stay (HOSPITAL_COMMUNITY): Payer: Self-pay

## 2018-12-29 MED ORDER — SODIUM CHLORIDE 0.9 % IV SOLN
INTRAVENOUS | Status: DC
  Administered 2018-12-29 – 2019-01-03 (×17): via INTRAVENOUS

## 2018-12-29 NOTE — Evaluation (Signed)
Physical Therapy Evaluation Patient Details Name: Jeremy Lane MRN: [id number] DOB: [birth year] Today's Date: 12/29/2018   History of Present Illness  Pt is a 23 y/o male admitted secondary to seizure like activity. Workup in process. MRI revealed Cortical edema high right parietal lobe. Differential includes right temporal lobe-seizure edema versus low-grade tumor versus HIV encephalitis. Pt also with suicidal thoughts and had psychiatry evaluation. PMH includes HTN, alcohol abuse, drug abuse.   Clinical Impression  Pt admitted secondary to problem above with deficits below. Pt with functional weakness in LLE and presenting with decreased coordination in LLE during gait. Required min-mod A for steadying assist with HHA. Discussed CIR vs home this session, however, pt prefers to go home at this time. Reports his aunt can assist as needed. Will continue to follow acutely to maximize functional mobility independence and safety.     Follow Up Recommendations Home health PT;Supervision/Assistance - 24 hour(pt refusing CIR at this time)    Equipment Recommendations  Other (comment)(RW vs Cane)    Recommendations for Other Services       Precautions / Restrictions Precautions Precautions: Fall Restrictions Weight Bearing Restrictions: No      Mobility  Bed Mobility Overal bed mobility: Modified Independent                Transfers Overall transfer level: Needs assistance Equipment used: 1 person hand held assist Transfers: Sit to/from Stand Sit to Stand: Min assist;+2 safety/equipment         General transfer comment: Min A for lift assist and steadying assist.   Ambulation/Gait Ambulation/Gait assistance: Min assist;Mod assist;+2 safety/equipment Gait Distance (Feet): 50 Feet Assistive device: 1 person hand held assist Gait Pattern/deviations: Step-through pattern;Decreased stride length;Ataxic;Narrow base of support;Scissoring Gait velocity: Decreased    General  Gait Details: Slow, unsteady gait, requiring min to mod A for steadying. Very narrow BOS (almost scissoring type gait) and pt with exaggerated step height on the LLE. Pt reports he felt difficulty coordinating steps.   Stairs            Wheelchair Mobility    Modified Rankin (Stroke Patients Only)       Balance Overall balance assessment: Needs assistance Sitting-balance support: No upper extremity supported;Feet supported Sitting balance-Leahy Scale: Fair     Standing balance support: No upper extremity supported;During functional activity;Single extremity supported Standing balance-Leahy Scale: Fair Standing balance comment: Able to maintain static standing without UE support to wash hands at sink.                              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 Pt reports tingling sensation throughout LLE as well.  LLE Sensation: decreased light touch LLE Coordination:  decreased gross motor    Cervical / Trunk Assessment Cervical / Trunk Assessment: Normal  Communication   Communication: No difficulties  Cognition Arousal/Alertness:  Awake/alert Behavior During Therapy: WFL for tasks assessed/performed Overall Cognitive Status: No family/caregiver present to determine baseline cognitive functioning                                 General Comments: Pt with psych history. Pt making telephone call during session, but then stated "oh, we can keep going with the evaluation."       General Comments General comments (skin integrity, edema, etc.): Educated about CIR vs home and pt requesting to go home at this time. Also reports he does not want to use a RW and would rather use a crutch.     Exercises     Assessment/Plan    PT Assessment Patient needs continued PT services  PT Problem List Decreased strength;Decreased balance;Decreased coordination;Decreased mobility;Decreased knowledge of use of DME;Decreased knowledge of precautions;Decreased safety awareness       PT Treatment Interventions DME instruction;Gait training;Stair training;Functional mobility training;Therapeutic activities;Therapeutic exercise;Balance training;Patient/family education    PT Goals (Current goals can be found in the Care Plan section)  Acute Rehab PT Goals Patient Stated Goal: to go home  PT Goal Formulation: With patient Time For Goal Achievement: 01/12/19 Potential to Achieve Goals: Good    Frequency Min 3X/week   Barriers to discharge        Co-evaluation PT/OT/SLP Co-Evaluation/Treatment: Yes Reason for Co-Treatment: For patient/therapist safety;To address functional/ADL transfers PT goals addressed during session: Mobility/safety with mobility;Balance         AM-PAC PT "6 Clicks" Mobility  Outcome Measure Help needed turning from your back to your side while in a flat bed without using bedrails?: None Help needed moving from lying on your back to sitting on the side of a flat bed without using bedrails?: None Help needed moving to and from a bed to a chair (including a wheelchair)?: A Little Help needed  standing up from a chair using your arms (e.g., wheelchair or bedside chair)?: A Little Help needed to walk in hospital room?: A Lot Help needed climbing 3-5 steps with a railing? : A Lot 6 Click Score: 18    End of Session Equipment Utilized During Treatment: Gait belt Activity Tolerance: Patient tolerated treatment well Patient left: in bed;with call bell/phone within reach;with bed alarm set Nurse Communication: Mobility status PT Visit Diagnosis: Unsteadiness on feet (R26.81);Muscle weakness (generalized) (M62.81);Difficulty in walking, not elsewhere classified (R26.2)    Time: 7341-9379 PT Time Calculation (min) (ACUTE ONLY): 23 min   Charges:   PT Evaluation $PT Eval Moderate Complexity: 1 Mod          Jeremy Lane, PT, DPT  Acute Rehabilitation Services  Pager: [phone] Office: [phone]   Jeremy Lane 12/29/2018, 3:14 PM

## 2018-12-29 NOTE — Evaluation (Addendum)
Occupational Therapy Evaluation Patient Details Name: Jeremy DoppMarcus Lane MRN: [id number] DOB: December 01, 1995 Today's Date: 12/29/2018    History of Present Illness Pt is a 23 y/o male admitted secondary to seizure like activity. Workup in process. MRI revealed Cortical edema high right parietal lobe. Differential includes right temporal lobe-seizure edema versus low-grade tumor versus HIV encephalitis. Pt also with suicidal thoughts and had psychiatry evaluation. PMH includes HTN, alcohol abuse, drug abuse.    Clinical Impression   PTA patient independent and working (does not drive). Admitted for above and limited by problem list below, including impaired balance, decreased safety awareness, decreased attention, and limited problem solving.  Patient requires min assist for transfers, min-mod assist for mobility, min guard for grooming standing at sink, and min assist for LB ADLs.  He reports his aunt can assist as needed, but need to confirm assist level with family. Patient will benefit from continued OT services while admitted and [facility] in order to maximize independence and safety with ADLs/mobility.  Will follow acutely.     Follow Up Recommendations  Home health OT;Supervision/Assistance - 24 hour    Equipment Recommendations  3 in 1 bedside commode    Recommendations for Other Services       Precautions / Restrictions Precautions Precautions: Fall Restrictions Weight Bearing Restrictions: No      Mobility Bed Mobility Overal bed mobility: Modified Independent                Transfers Overall transfer level: Needs assistance Equipment used: 1 person hand held assist Transfers: Sit to/from Stand Sit to Stand: Min assist;+2 safety/equipment         General transfer comment: Min A for to power up into standing and for steadying assist.     Balance Overall balance assessment: Needs assistance Sitting-balance support: No upper extremity supported;Feet  supported Sitting balance-Leahy Scale: Fair     Standing balance support: No upper extremity supported;During functional activity;Single extremity supported Standing balance-Leahy Scale: Fair Standing balance comment: Able to maintain static standing without UE support to wash hands at sink.                            ADL either performed or assessed with clinical judgement   ADL Overall ADL's : Needs assistance/impaired     Grooming: Wash/dry hands;Min guard;Standing   Upper Body Bathing: Set up;Sitting   Lower Body Bathing: Minimal assistance;Sit to/from stand   Upper Body Dressing : Set up;Sitting   Lower Body Dressing: Minimal assistance;Sit to/from stand Lower Body Dressing Details (indicate cue type and reason): able to don/doff socks using figure 4 technique; min assist +2 sit to stand  Toilet Transfer: Minimal assistance;+2 for safety/equipment;Ambulation Toilet Transfer Details (indicate cue type and reason): simulated in room         Functional mobility during ADLs: Minimal assistance;Moderate assistance;+2 for safety/equipment General ADL Comments: pt limited by attention to task, coordination, and impaired balance     Vision Baseline Vision/History: No visual deficits Patient Visual Report: No change from baseline Vision Assessment?: No apparent visual deficits     Perception     Praxis      Pertinent Vitals/Pain Pain Assessment: No/denies pain     Hand Dominance Right   Extremity/Trunk Assessment Upper Extremity Assessment Upper Extremity Assessment: Overall WFL for tasks assessed   Lower Extremity Assessment Lower Extremity Assessment: Defer to PT evaluation    Cervical / Trunk Assessment Cervical /  Trunk Assessment: Normal   Communication Communication Communication: No difficulties   Cognition Arousal/Alertness: Awake/alert Behavior During Therapy: WFL for tasks assessed/performed Overall Cognitive Status: No family/caregiver  present to determine baseline cognitive functioning Area of Impairment: Attention;Following commands;Safety/judgement;Awareness;Problem solving                   Current Attention Level: Sustained   Following Commands: Follows one step commands consistently;Follows one step commands with increased time;Follows multi-step commands inconsistently Safety/Judgement: Decreased awareness of safety;Decreased awareness of deficits Awareness: Emergent Problem Solving: Difficulty sequencing;Requires verbal cues General Comments: Pt with psych history. Pt making telephone call during session, but then stated "oh, we can keep going with the evaluation."  Poor attention to task during session, following 1 step commands but not multiple but anticipate may have been distracted on the phone.    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)         OT Problem List: Decreased activity tolerance;Impaired balance (sitting and/or standing);Decreased coordination;Decreased safety awareness;Decreased knowledge of use of DME or AE      OT Treatment/Interventions: Self-care/ADL training;DME and/or AE instruction;Therapeutic activities;Patient/family education;Balance training;Cognitive remediation/compensation    OT Goals(Current goals can be found in the care plan section) Acute Rehab OT Goals Patient Stated Goal:  to go home  OT Goal Formulation: With patient Time For Goal Achievement: 01/12/19 Potential to Achieve Goals: Good  OT Frequency: Min 2X/week   Barriers to D/C:            Co-evaluation PT/OT/SLP Co-Evaluation/Treatment: Yes Reason for Co-Treatment: For patient/therapist safety;To address functional/ADL transfers PT goals addressed during session: Mobility/safety with mobility;Balance OT goals addressed during session: ADL's and self-care      AM-PAC OT "6 Clicks" Daily Activity     Outcome Measure Help from another person eating meals?: None Help from another person taking care of personal grooming?: A Little Help from another person toileting, which includes using toliet, bedpan, or urinal?: A Little Help from another person bathing (including washing, rinsing, drying)?: A Little Help from another person to put on and taking off regular upper body clothing?: None Help from another person to put on and taking off regular lower body clothing?: A Little 6 Click Score: 20   End of Session Equipment Utilized During Treatment: Gait belt Nurse Communication: Mobility status  Activity Tolerance: Patient tolerated treatment well Patient left: in bed;with call bell/phone within reach;with bed alarm set  OT Visit Diagnosis: Other abnormalities of gait and mobility (R26.89);Other symptoms and signs involving the nervous system (R29.898)                Time: 0630-1601 OT Time Calculation (min): 18 min Charges:  OT General Charges $OT Visit: 1 Visit OT Evaluation $OT Eval Moderate Complexity: 1 Mod  Chancy Milroy, OT Acute Rehabilitation Services Pager [phone] Office [phone]   Chancy Milroy 12/29/2018, 4:04 PM

## 2018-12-29 NOTE — Progress Notes (Signed)
PROGRESS NOTE    Jeremy Lane  ZOX:[id number] DOB: [birth year] DOA: 12/27/2018 PCP: Patient, No Pcp Per    Brief Narrative:  Jeremy Lane is a 23 y.o. male with past medical history of hypertension, heavy alcohol abuse, marijuana use presents to the ED with continuous seizure-like activity since 5 days.  He was admitted for evaluation of seizures.   Assessment & Plan:   Principal Problem:   Seizure (HCC) Active Problems:   Essential hypertension   Elevated LFTs   Polysubstance abuse (HCC)   Adjustment disorder with disturbance of emotion   Adjustment disorder with disturbance of emotion   Seizures:  Differential include ? Alcohol induced vs HIV encephalitis.  Pt reports his twitching improved.  He was started on keppra 1 g BID . Change to oral today.  Neurology consulted and recommendations given.  MRI brain reviewed.  Neurology recommends another MRI in 4 weeks and outpatient neurology follow up.  HIV test pending  Seizures precautions.     Essential hypertension;  Well controlled.    Elevated liver function tests: Suspect from alcohol use and rhabdomyolysis.  US liver wnl.     Alcohol abuse:  Pt restless , reports cramping leg pain.  CK levels ordered.  CIWA protocol.   Pt initially verbalized suicidal thoughts earlier, but he was cleared by psychiatry.  Psychiatry consulted.  He does not report any suicidal or homicidal thoughts.    Rhabdomyolysis:  Elevated CK levels, pt reports persistent crampy leg pains.  Increase IV fuids to 125 ml/hr, recheck CK levels in am and if improving, possible d/c in am.       DVT prophylaxis:lovenox.  Code Status: full code.  Family Communication: discussed with aunt over the phone.  Disposition Plan: d/c in am, if CK levels improve.    Consultants:   Neurology  Psychiatry.    Procedures: MRI brain  EEG  US abdomen / liver.   Antimicrobials: none.   Subjective: Persistent leg cramps.    Objective: Vitals:   12/28/18 2105 12/29/18 0127 12/29/18 0451 12/29/18 0821  BP: 120/75 (!) 126/93 115/76 116/71  Pulse: 72 (!) 52 (!) 54 60  Resp: Temp: 97.9 F (36.6 C) 97.7 F (36.5 C) 97.7 F (36.5 C) 97.8 F (36.6 C)  TempSrc: Oral Oral Oral Oral  SpO2: 100% 97% 100% 100%  Weight:      Height:        Intake/Output Summary (Last 24 hours) at 12/29/2018 1122 Last data filed at 12/29/2018 0457 Gross per 24 hour  Intake 320 ml  Output 2560 ml  Net -2240 ml   Filed Weights   12/27/18 0228  Weight: 64.9 kg    Examination:  General exam: mild distress from leg cramps.  Respiratory system: Clear to auscultation. Respiratory effort normal. Cardiovascular system: S1 & S2 heard, RRR. Marland Kitchen No pedal edema. Gastrointestinal system: Abdomen is soft non distended, bowel sounds good.  Central nervous system: Alert and oriented. No focal neurological deficits. Extremities: Symmetric 5 x 5 power. Skin: No rashes, lesions or ulcers Psychiatry: restless ,    Data Reviewed: I have personally reviewed following labs and imaging studies  CBC: Recent Labs  Lab 12/27/18 0538 12/28/18 0701  WBC 3.2* 3.4*  NEUTROABS 0.8*  --   HGB 12.4* 12.5*  HCT 38.1* 38.7*  MCV 84.9 84.3  PLT 176 160   Basic Metabolic Panel: Recent Labs  Lab 12/27/18 0538 12/28/18 0701  NA 136 139  K 3.8 3.9  CL 101 108  CO2 26 24  GLUCOSE 85 91  BUN 9 9  CREATININE 0.42* 0.46*  CALCIUM 9.4 8.9  MG 1.9  --   PHOS 4.5  --    GFR: Estimated Creatinine Clearance: 129.6 mL/min (A) (by C-G formula based on SCr of 0.46 mg/dL (L)). Liver Function Tests: Recent Labs  Lab 12/27/18 0538 12/28/18 0701  AST 199* 148*  ALT 187* 156*  ALKPHOS 39 36*  BILITOT 0.6 0.6  PROT 7.5 7.2  ALBUMIN 3.5 3.1*   No results for input(s): LIPASE, AMYLASE in the last 168 hours. No results for input(s): AMMONIA in the last 168 hours. Coagulation Profile: No results for input(s): INR, PROTIME in the  last 168 hours. Cardiac Enzymes: Recent Labs  Lab 12/28/18 1808  CKTOTAL 9,312*   BNP (last 3 results) No results for input(s): PROBNP in the last 8760 hours. HbA1C: No results for input(s): HGBA1C in the last 72 hours. CBG: Recent Labs  Lab 12/27/18 0518  GLUCAP 89   Lipid Profile: No results for input(s): CHOL, HDL, LDLCALC, TRIG, CHOLHDL, LDLDIRECT in the last 72 hours. Thyroid Function Tests: No results for input(s): TSH, T4TOTAL, FREET4, T3FREE, THYROIDAB in the last 72 hours. Anemia Panel: No results for input(s): VITAMINB12, FOLATE, FERRITIN, TIBC, IRON, RETICCTPCT in the last 72 hours. Sepsis Labs: No results for input(s): PROCALCITON, LATICACIDVEN in the last 168 hours.  Recent Results (from the past 240 hour(s))  SARS Coronavirus 2 (CEPHEID - Performed in [facility] hospital lab), Hosp Order     Status: None   Collection Time: 12/27/18  8:03 AM  Result Value Ref Range Status   SARS Coronavirus 2 NEGATIVE NEGATIVE Final    Comment: (NOTE) If result is NEGATIVE SARS-CoV-2 target nucleic acids are NOT DETECTED. The SARS-CoV-2 RNA is generally detectable in upper and lower  respiratory specimens during the acute phase of infection. The lowest  concentration of SARS-CoV-2 viral copies this assay can detect is 250  copies / mL. A negative result does not preclude SARS-CoV-2 infection  and should not be used as the sole basis for treatment or other  patient management decisions.  A negative result may occur with  improper specimen collection / handling, submission of specimen other  than nasopharyngeal swab, presence of viral mutation(s) within the  areas targeted by this assay, and inadequate number of viral copies  (<250 copies / mL). A negative result must be combined with clinical  observations, patient history, and epidemiological information. If result is POSITIVE SARS-CoV-2 target nucleic acids are DETECTED. The SARS-CoV-2 RNA is generally detectable in upper  and lower  respiratory specimens dur ing the acute phase of infection.  Positive  results are indicative of active infection with SARS-CoV-2.  Clinical  correlation with patient history and other diagnostic information is  necessary to determine patient infection status.  Positive results do  not rule out bacterial infection or co-infection with other viruses. If result is PRESUMPTIVE POSTIVE SARS-CoV-2 nucleic acids MAY BE PRESENT.   A presumptive positive result was obtained on the submitted specimen  and confirmed on repeat testing.  While 2019 novel coronavirus  (SARS-CoV-2) nucleic acids may be present in the submitted sample  additional confirmatory testing may be necessary for epidemiological  and / or clinical management purposes  to differentiate between  SARS-CoV-2 and other Sarbecovirus currently known to infect humans.  If clinically indicated additional testing with an alternate test  methodology [phone]) is advised. The SARS-CoV-2 RNA is generally  detectable in upper and lower respiratory sp ecimens during the acute  phase of infection. The expected result is Negative. Fact Sheet for Patients:  BoilerBrush.com.cyhttps://www.fda.gov/media/136312/download Fact Sheet for Healthcare Providers: https://pope.com/https://www.fda.gov/media/136313/download This test is not yet approved or cleared by the [place] FDA and has been authorized for detection and/or diagnosis of SARS-CoV-2 by FDA under an Emergency Use Authorization (EUA).  This EUA will remain in effect (meaning this test can be used) for the duration of the COVID-19 declaration under Section 564(b)(1) of the Act, 21 U.S.C. section 360bbb-3(b)(1), unless the authorization is terminated or revoked sooner. Performed at [facility], [address] [place], [place] [address]   CSF culture     Status: None (Preliminary result)   Collection Time: 12/27/18  1:45 PM  Result Value Ref Range Status   Specimen Description CSF  Final   Special  Requests NONE  Final   Gram Stain NO WBC SEEN NO ORGANISMS SEEN CYTOSPIN SMEAR   Final   Culture   Final    NO GROWTH 2 DAYS Performed at [facility], [address] [place], [place] [address]    Report Status PENDING  Incomplete         Radiology Studies: [place] Abdomen Limited  Result Date: 12/29/2018 CLINICAL DATA:  Initial evaluation for elevated LFTs. EXAM: ULTRASOUND ABDOMEN LIMITED RIGHT UPPER QUADRANT COMPARISON:  None available. FINDINGS: Gallbladder: No gallstones or wall thickening visualized. No sonographic Murphy sign noted by sonographer. Common bile duct: Diameter: 2.4 mm Liver: No focal lesion identified. Within normal limits in parenchymal echogenicity. Portal vein is patent on color Doppler imaging with normal direction of blood flow towards the liver. IMPRESSION: Normal right upper quadrant ultrasound. Electronically Signed   By: Rise MuBenjamin  McClintock M.D.   On: 12/29/2018 06:59   Dg Fluoro Guide Lumbar Puncture  Result Date: 12/27/2018 CLINICAL DATA:  Seizure.  Abnormal brain MRI. EXAM: LUMBAR PUNCTURE UNDER FLUOROSCOPY FLUOROSCOPY TIME:  0.1 minute; 15 uGym2 DAP TECHNIQUE: The procedure, risks (including but not limited to bleeding, infection, organ damage ), benefits, and alternatives were explained to the patient. Questions regarding the procedure were encouraged and answered. The patient understands and consents to the procedure. An appropriate skin entry site was determined fluoroscopically. Operator donned sterile gloves and mask. Skin site was marked, then prepped with Betadine, draped in usual sterile fashion, and infiltrated locally with 1% lidocaine. A 20 gauge spinal needle advanced into the thecal sac at L3-4 from a right interlaminar approach. Clear colorless CSF spontaneously returned, with opening pressure of 12 cm water. 9ml CSF were collected and divided among 4 sterile vials for the requested laboratory studies. The needle was then removed.  COMPLICATIONS: None immediate IMPRESSION: 1. Technically successful lumbar puncture under fluoroscopy. Electronically Signed   By: Corlis Leak  Hassell M.D.   On: 12/27/2018 14:02        Scheduled Meds: . docusate sodium  100 mg Oral BID  . folic acid  1 mg Oral Daily  . multivitamin with minerals  1 tablet Oral Daily  . thiamine  100 mg Oral Daily   Continuous Infusions: . sodium chloride 125 mL/hr at 12/29/18 1009  . levETIRAcetam 1,000 mg (12/29/18 0733)     LOS: 1 day    Time spent: 28 minutes.     Kathlen ModyVijaya Evynn Boutelle, MD [facility] Pager [phone]   If 7PM-7AM, please contact night-coverage [email or web address] Password [email or web address] 12/29/2018, 11:22 AM

## 2018-12-29 NOTE — Plan of Care (Signed)
Patient stable, discussed POC with patient, agreeable with plan, denies question/concerns at this time.  

## 2018-12-30 LAB — CSF CULTURE W GRAM STAIN
Culture: NO GROWTH
Gram Stain: NONE SEEN

## 2018-12-30 LAB — HEPATITIS PANEL, ACUTE
HCV Ab: <0.1 s/co ratio (ref 0.0–0.9)
Hep A IgM: NEGATIVE
Hep B C IgM: NEGATIVE
Hepatitis B Surface Ag: NEGATIVE

## 2018-12-30 LAB — CK: Total CK: 8645 U/L — ABNORMAL HIGH (ref 49–397)

## 2018-12-30 MED ORDER — LEVETIRACETAM 500 MG PO TABS
1000.0000 mg | ORAL_TABLET | Freq: Two times a day (BID) | ORAL | Status: DC
  Administered 2018-12-30 – 2019-01-03 (×8): 1000 mg via ORAL
  Filled 2018-12-30 (×9): qty 2

## 2018-12-30 NOTE — Progress Notes (Signed)
PROGRESS NOTE    Jeremy Lane  AVW:[id number] DOB: 02/24/1996 DOA: 12/27/2018 PCP: Patient, No Pcp Per    Brief Narrative:  Jeremy Lane is a 23 y.o. male with past medical history of hypertension, heavy alcohol abuse, marijuana use presents to the ED with continuous seizure-like activity since 5 days.  He was admitted for evaluation of seizures.   Assessment & Plan:   Principal Problem:   Seizure (HCC) Active Problems:   Essential hypertension   Elevated LFTs   Polysubstance abuse (HCC)   Adjustment disorder with disturbance of emotion   Adjustment disorder with disturbance of emotion   Seizures:  Differential include ? Alcohol induced vs HIV encephalitis. Vs primary epileptic disorder Pt reports his twitching improved but continues to have persistent leg cramps.  He was started on keppra 1 g BID . Change to oral today.  Neurology consulted and recommendations given.  MRI brain reviewed.  Neurology recommends another MRI in 4 weeks and outpatient neurology follow up.  HIV test pending  Seizures precautions.     Essential hypertension;  Well controlled.    Elevated liver function tests: Suspect from alcohol use and rhabdomyolysis.  US liver wnl.     Alcohol abuse:  He reports persistent cramping leg pain.  CK levels ordered still very high > 8000.  CIWA protocol.   Pt initially verbalized suicidal thoughts earlier, but he was cleared by psychiatry.  Psychiatry consulted.  He does not report any suicidal or homicidal thoughts.    Rhabdomyolysis:  Elevated CK levels >8000,  pt reports persistent crampy leg pains.  Increase IV fuids to 125 ml/hr, recheck CK levels in am and if improving, possible d/c in am.       DVT prophylaxis:lovenox.  Code Status: full code.  Family Communication: discussed with aunt over the phone.  Disposition Plan: d/c in am, if CK levels improve.    Consultants:   Neurology  Psychiatry.    Procedures: MRI brain   EEG  US abdomen / liver.   Antimicrobials: none.   Subjective: Persistent leg cramps.   Objective: Vitals:   12/29/18 1941 12/30/18 0013 12/30/18 0339 12/30/18 0855  BP: (!) 129/92 121/77 117/88 (!) 110/58  Pulse: (!) 55 (!) 53 61 (!) 52  Resp: 15 16 16 16   Temp: (!) 97.5 F (36.4 C) 98.4 F (36.9 C) 97.7 F (36.5 C) 97.6 F (36.4 C)  TempSrc: Oral Oral Oral Oral  SpO2: 100% 100% 98% 100%  Weight:      Height:        Intake/Output Summary (Last 24 hours) at 12/30/2018 1149 Last data filed at 12/30/2018 0519 Gross per 24 hour  Intake 1389.97 ml  Output 800 ml  Net 589.97 ml   Filed Weights   12/27/18 0228  Weight: 64.9 kg    Examination:  General exam: mild distress from leg cramps.  Respiratory system: Clear to auscultation. Respiratory effort normal. Cardiovascular system: S1 & S2 heard, RRR. Marland Kitchen. No pedal edema. Gastrointestinal system: Abdomen is soft non distended, bowel sounds good.  Central nervous system: Alert and oriented. No focal neurological deficits. Extremities: Symmetric 5 x 5 power. Skin: No rashes, lesions or ulcers Psychiatry: restless ,    Data Reviewed: I have personally reviewed following labs and imaging studies  CBC: Recent Labs  Lab 12/27/18 0538 12/28/18 0701  WBC 3.2* 3.4*  NEUTROABS 0.8*  --   HGB 12.4* 12.5*  HCT 38.1* 38.7*  MCV 84.9 84.3  PLT 176 160  Basic Metabolic Panel: Recent Labs  Lab 12/27/18 0538 12/28/18 0701  NA 136 139  K 3.8 3.9  CL 101 108  CO2 26 24  GLUCOSE 85 91  BUN 9 9  CREATININE 0.42* 0.46*  CALCIUM 9.4 8.9  MG 1.9  --   PHOS 4.5  --    GFR: Estimated Creatinine Clearance: 129.6 mL/min (A) (by C-G formula based on SCr of 0.46 mg/dL (L)). Liver Function Tests: Recent Labs  Lab 12/27/18 0538 12/28/18 0701  AST 199* 148*  ALT 187* 156*  ALKPHOS 39 36*  BILITOT 0.6 0.6  PROT 7.5 7.2  ALBUMIN 3.5 3.1*   No results for input(s): LIPASE, AMYLASE in the last 168 hours. No results  for input(s): AMMONIA in the last 168 hours. Coagulation Profile: No results for input(s): INR, PROTIME in the last 168 hours. Cardiac Enzymes: Recent Labs  Lab 12/28/18 1808 12/30/18 0930  CKTOTAL 9,312* 8,645*   BNP (last 3 results) No results for input(s): PROBNP in the last 8760 hours. HbA1C: No results for input(s): HGBA1C in the last 72 hours. CBG: Recent Labs  Lab 12/27/18 0518  GLUCAP 89   Lipid Profile: No results for input(s): CHOL, HDL, LDLCALC, TRIG, CHOLHDL, LDLDIRECT in the last 72 hours. Thyroid Function Tests: No results for input(s): TSH, T4TOTAL, FREET4, T3FREE, THYROIDAB in the last 72 hours. Anemia Panel: No results for input(s): VITAMINB12, FOLATE, FERRITIN, TIBC, IRON, RETICCTPCT in the last 72 hours. Sepsis Labs: No results for input(s): PROCALCITON, LATICACIDVEN in the last 168 hours.  Recent Results (from the past 240 hour(s))  SARS Coronavirus 2 (CEPHEID - Performed in [facility] hospital lab), Hosp Order     Status: None   Collection Time: 12/27/18  8:03 AM  Result Value Ref Range Status   SARS Coronavirus 2 NEGATIVE NEGATIVE Final    Comment: (NOTE) If result is NEGATIVE SARS-CoV-2 target nucleic acids are NOT DETECTED. The SARS-CoV-2 RNA is generally detectable in upper and lower  respiratory specimens during the acute phase of infection. The lowest  concentration of SARS-CoV-2 viral copies this assay can detect is 250  copies / mL. A negative result does not preclude SARS-CoV-2 infection  and should not be used as the sole basis for treatment or other  patient management decisions.  A negative result may occur with  improper specimen collection / handling, submission of specimen other  than nasopharyngeal swab, presence of viral mutation(s) within the  areas targeted by this assay, and inadequate number of viral copies  (<250 copies / mL). A negative result must be combined with clinical  observations, patient history, and epidemiological  information. If result is POSITIVE SARS-CoV-2 target nucleic acids are DETECTED. The SARS-CoV-2 RNA is generally detectable in upper and lower  respiratory specimens dur ing the acute phase of infection.  Positive  results are indicative of active infection with SARS-CoV-2.  Clinical  correlation with patient history and other diagnostic information is  necessary to determine patient infection status.  Positive results do  not rule out bacterial infection or co-infection with other viruses. If result is PRESUMPTIVE POSTIVE SARS-CoV-2 nucleic acids MAY BE PRESENT.   A presumptive positive result was obtained on the submitted specimen  and confirmed on repeat testing.  While 2019 novel coronavirus  (SARS-CoV-2) nucleic acids may be present in the submitted sample  additional confirmatory testing may be necessary for epidemiological  and / or clinical management purposes  to differentiate between  SARS-CoV-2 and other Sarbecovirus currently known to  infect humans.  If clinically indicated additional testing with an alternate test  methodology [phone]) is advised. The SARS-CoV-2 RNA is generally  detectable in upper and lower respiratory sp ecimens during the acute  phase of infection. The expected result is Negative. Fact Sheet for Patients:  BoilerBrush.com.cy Fact Sheet for Healthcare Providers: https://pope.com/ This test is not yet approved or cleared by the [place] FDA and has been authorized for detection and/or diagnosis of SARS-CoV-2 by FDA under an Emergency Use Authorization (EUA).  This EUA will remain in effect (meaning this test can be used) for the duration of the COVID-19 declaration under Section 564(b)(1) of the Act, 21 U.S.C. section 360bbb-3(b)(1), unless the authorization is terminated or revoked sooner. Performed at [facility], [address], [place], [place] [address]   CSF culture     Status: None  (Preliminary result)   Collection Time: 12/27/18  1:45 PM  Result Value Ref Range Status   Specimen Description CSF  Final   Special Requests NONE  Final   Gram Stain NO WBC SEEN NO ORGANISMS SEEN CYTOSPIN SMEAR   Final   Culture   Final    NO GROWTH 3 DAYS Performed at [facility], [address], [place], [place] [address]    Report Status PENDING  Incomplete         Radiology Studies: US Abdomen Limited  Result Date: 12/29/2018 CLINICAL DATA:  Initial evaluation for elevated LFTs. EXAM: ULTRASOUND ABDOMEN LIMITED RIGHT UPPER QUADRANT COMPARISON:  None available. FINDINGS: Gallbladder: No gallstones or wall thickening visualized. No sonographic Murphy sign noted by sonographer. Common bile duct: Diameter: 2.4 mm Liver: No focal lesion identified. Within normal limits in parenchymal echogenicity. Portal vein is patent on color Doppler imaging with normal direction of blood flow towards the liver. IMPRESSION: Normal right upper quadrant ultrasound. Electronically Signed   By: Rise Mu M.D.   On: 12/29/2018 06:59        Scheduled Meds: . docusate sodium  100 mg Oral BID  . folic acid  1 mg Oral Daily  . multivitamin with minerals  1 tablet Oral Daily  . thiamine  100 mg Oral Daily   Continuous Infusions: . sodium chloride 125 mL/hr at 12/29/18 2005  . levETIRAcetam 1,000 mg (12/30/18 0712)     LOS: 2 days    Time spent: 26 minutes.     Kathlen Mody, MD [facility] Pager [phone]   If 7PM-7AM, please contact night-coverage [email or web address] Password [email or web address] 12/30/2018, 11:49 AM

## 2018-12-30 NOTE — Evaluation (Signed)
Physical Therapy Treatment Patient Details Name: Jeremy Lane MRN: [id number] DOB: 04/08/1996 Today's Date: 12/30/2018    History of Present Illness Pt is a 23 y/o male admitted secondary to seizure like activity. Workup in process. MRI revealed Cortical edema high right parietal lobe. Differential includes right temporal lobe-seizure edema versus low-grade tumor versus HIV encephalitis. Pt also with suicidal thoughts and had psychiatry evaluation. PMH includes HTN, alcohol abuse, drug abuse.     PT Comments    Patient ambulated with single point cane today. He required guarding for balance but was abel to self correct. He ambulated increased distance today with decreased assist. He reports he would feel more comfortable with the cane then the walker. Acute therapy will continue to work with the patient. Current plan for home with home health remains appropriate.   Follow Up Recommendations  Home health PT;Supervision/Assistance - 24 hour     Equipment Recommendations  Other (comment)    Recommendations for Other Services       Precautions / Restrictions Precautions Precautions: Fall Restrictions Weight Bearing Restrictions: No    Mobility  Bed Mobility Overal bed mobility: Modified Independent             General bed mobility comments: no assist required to get out of bed  Transfers Overall transfer level: Needs assistance Equipment used: Straight cane   Sit to Stand: Min guard         General transfer comment: mnin gaurd for initial balance.   Ambulation/Gait Ambulation/Gait assistance: Min guard Gait Distance (Feet): 250 Feet Assistive device: Straight cane Gait Pattern/deviations: Step-through pattern;Decreased stride length;Ataxic;Narrow base of support;Drifts right/left Gait velocity: Decreased  Gait velocity interpretation: <1.31 ft/sec, indicative of household ambulator General Gait Details: at times had minor LOB using the cane but was bale to self  correct.    Stairs             Wheelchair Mobility    Modified Rankin (Stroke Patients Only)       Balance Overall balance assessment: Needs assistance Sitting-balance support: No upper extremity supported;Feet supported Sitting balance-Leahy Scale: Good     Standing balance support: Single extremity supported Standing balance-Leahy Scale: Fair Standing balance comment: pertabations with gait but able to self correct.                             Cognition Arousal/Alertness: Awake/alert Behavior During Therapy: WFL for tasks assessed/performed Overall Cognitive Status: No family/caregiver present to determine baseline cognitive functioning                     Current Attention Level: Sustained   Following Commands: Follows one step commands consistently;Follows one step commands with increased time;Follows multi-step commands inconsistently       General Comments: improved safety awareness today       Exercises      General Comments General comments (skin integrity, edema, etc.): Patient requests to go home. Nursing notified       Pertinent Vitals/Pain Pain Assessment: No/denies pain    Home Living                      Prior Function            PT Goals (current goals can now be found in the care plan section) Acute Rehab PT Goals Patient Stated Goal: to go home  PT Goal Formulation: With patient Time For Goal Achievement: 01/12/19  Potential to Achieve Goals: Good Progress towards PT goals: Progressing toward goals    Frequency    Min 3X/week      PT Plan Current plan remains appropriate    Co-evaluation              AM-PAC PT "6 Clicks" Mobility   Outcome Measure  Help needed turning from your back to your side while in a flat bed without using bedrails?: None Help needed moving from lying on your back to sitting on the side of a flat bed without using bedrails?: None Help needed moving to and from  a bed to a chair (including a wheelchair)?: A Little Help needed standing up from a chair using your arms (e.g., wheelchair or bedside chair)?: A Little Help needed to walk in hospital room?: A Little Help needed climbing 3-5 steps with a railing? : A Lot 6 Click Score: 19    End of Session Equipment Utilized During Treatment: Gait belt Activity Tolerance: Patient tolerated treatment well Patient left: in bed;with call bell/phone within reach;with bed alarm set Nurse Communication: Mobility status PT Visit Diagnosis: Unsteadiness on feet (R26.81);Muscle weakness (generalized) (M62.81);Difficulty in walking, not elsewhere classified (R26.2)     Time: 1150-1210 PT Time Calculation (min) (ACUTE ONLY): 20 min  Charges:  $Gait Training: 8-22 mins                         Dessie Coma PT DPT  12/30/2018, 1:19 PM

## 2018-12-30 NOTE — Progress Notes (Signed)
OT Cancellation Note  Patient Details Name: Jeremy Lane MRN: [id number] DOB: [birth year]   Cancelled Treatment:    Reason Eval/Treat Not Completed: Patient declined, no reason specified; pt declined reporting "I just fell asleep". Will follow and see as able.  Chancy Milroy, OT Acute Rehabilitation Services Pager [phone] Office [phone]   Chancy Milroy 12/30/2018, 3:52 PM

## 2018-12-30 NOTE — TOC Initial Note (Signed)
Transition of Care [facility]) - Initial/Assessment Note    Patient Details  Name: Jeremy Lane MRN: [id number] Date of Birth: 01-20-1996  Transition of Care [facility]) CM/SW Contact:    Kermit Balo, RN Phone Number: 12/30/2018, 4:41 PM  Clinical Narrative:                 Recommendations are for [facility]. Pt without insurance. [facility] is charity HH this week. MD please place HH orders.  Pt also to receive DME through charity from [employer].  CM was able to obtain him a PCP appt with [facility]. Information on the AVS.  Expected Discharge Plan: Home w Home Health Services Barriers to Discharge: Inadequate or no insurance(No PCP)   Patient Goals and CMS Choice        Expected Discharge Plan and Services Expected Discharge Plan: Home w Home Health Services     Post Acute Care Choice: Home Health, Durable Medical Equipment                   DME Arranged: 3-N-1, Cane DME Agency: [employer] Date DME Agency Contacted: 12/30/18 Time DME Agency Contacted: [phone] Representative spoke with at DME Agency: Zack HH Arranged: PT, OT HH Agency: [facility](Charity services) Date [facility] Agency Contacted: 12/30/18 Time HH Agency Contacted: [phone] Representative spoke with at [facility] Agency: Kandee Keen  Prior Living Arrangements/Services       Do you feel safe going back to the place where you live?: Yes               Activities of Daily Living Home Assistive Devices/Equipment: None ADL Screening (condition at time of admission) Patient's cognitive ability adequate to safely complete daily activities?: Yes Is the patient deaf or have difficulty hearing?: No Does the patient have difficulty seeing, even when wearing glasses/contacts?: No Does the patient have difficulty concentrating, remembering, or making decisions?: No Patient able to express need for assistance with ADLs?: Yes Does the patient have difficulty dressing or bathing?: No Independently performs ADLs?: Yes (appropriate for  developmental age) Does the patient have difficulty walking or climbing stairs?: Yes Weakness of Legs: Left Weakness of Arms/Hands: None  Permission Sought/Granted                  Emotional Assessment     Affect (typically observed): (lethargic)        Admission diagnosis:  Seizure (HCC) [R56.9] 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 Seizure (HCC) 12/27/2018  . Essential hypertension 12/27/2018  . Elevated LFTs 12/27/2018  . Polysubstance abuse (HCC) 12/27/2018   PCP:  Patient, No Pcp Per Pharmacy:   [id number] [place], [place] - [address] [place] [phone] Phone: [phone] Fax: [phone]     Social Determinants of Health (SDOH) Interventions    Readmission Risk Interventions No flowsheet data found.

## 2018-12-31 DIAGNOSIS — F4329 Adjustment disorder with other symptoms: Secondary | ICD-10-CM

## 2018-12-31 DIAGNOSIS — R945 Abnormal results of liver function studies: Secondary | ICD-10-CM

## 2018-12-31 DIAGNOSIS — F1910 Other psychoactive substance abuse, uncomplicated: Secondary | ICD-10-CM

## 2018-12-31 LAB — PATHOLOGIST SMEAR REVIEW

## 2018-12-31 LAB — BASIC METABOLIC PANEL
Anion gap: 9 (ref 5–15)
BUN: 8 mg/dL (ref 6–20)
CO2: 24 mmol/L (ref 22–32)
Calcium: 9.2 mg/dL (ref 8.9–10.3)
Chloride: 107 mmol/L (ref 98–111)
Creatinine, Ser: 0.43 mg/dL — ABNORMAL LOW (ref 0.61–1.24)
GFR calc Af Amer: >60 mL/min (ref >60)
GFR calc non Af Amer: >60 mL/min (ref >60)
Glucose, Bld: 90 mg/dL (ref 70–99)
Potassium: 3.8 mmol/L (ref 3.5–5.1)
Sodium: 140 mmol/L (ref 135–145)

## 2018-12-31 LAB — HERPES SIMPLEX VIRUS(HSV) DNA BY PCR: HSV 1 DNA: NEGATIVE

## 2018-12-31 LAB — CK: Total CK: 8941 U/L — ABNORMAL HIGH (ref 49–397)

## 2018-12-31 LAB — HIV 1/2 AB DIFFERENTIATION
HIV 1 Ab: POSITIVE — AB
HIV 2 Ab: NEGATIVE

## 2018-12-31 LAB — HIV ANTIBODY (ROUTINE TESTING W REFLEX): HIV Screen 4th Generation wRfx: REACTIVE — AB

## 2018-12-31 LAB — HSV DNA BY PCR (REFERENCE LAB): HSV 2 DNA: NEGATIVE

## 2018-12-31 MED ORDER — LORAZEPAM 0.5 MG PO TABS
0.5000 mg | ORAL_TABLET | Freq: Once | ORAL | Status: AC
  Administered 2018-12-31: 0.5 mg via ORAL
  Filled 2018-12-31: qty 1

## 2018-12-31 NOTE — Progress Notes (Signed)
PT Cancellation Note  Patient Details Name: Jeremy Lane MRN: [id number] DOB: 1996/03/19   Cancelled Treatment:     Patient declined to ambulate. He was agitated about not being discharged and because he could not see his family. He reports he may participate later in the afternoon.    Dessie Coma PT DPT  12/31/2018, 4:07 PM

## 2018-12-31 NOTE — Progress Notes (Addendum)
PROGRESS NOTE    Jeremy Lane  VCB:[id number] DOB: [birth year] DOA: 12/27/2018 PCP: Patient, No Pcp Per   Brief Narrative:  HPI On 12/27/2018 by Dr. Jonah Blue Jeremy Lane is a 23 y.o. male with medical history significant of HTN presenting with unilateral leg twitching.  The patient was quite somnolent at the time of my evaluation and was able to answer only a few questions.  He denied fever.  HPI per Dr. Laurence Slate:  Jeremy Lane is a 23 y.o. male with past medical history of hypertension, heavy alcohol abuse, marijuana use presents to the ED with continuous seizure-like activity since 5 days.  Per description from the EDP, left leg had continuous rhythmic activity from hip downwards. The patient states that this is been going on for about 5 days.  Patient was seen in the emergency department on 5/18 after presenting with possible seizure-like activity. He has some left pain and tingling followed by jerking. This happened just prior to him having to go to court. Patient was discharged from the ED with plan to follow-up with neurologist.  Patient states that since his discharge he has been having continuous jerking of the left leg that has persisted throughout the day. Patient was given 1 mg of Ativan and received a load of Dilantin.  On assessment, patient had received Dilantin and his leg jerking had stopped. Patient also started itching after receiving Dilantin. Assessment & Plan   Seizure -Possibly alcohol induced versus HIV encephalitis versus primary epileptic disorder -States twitching is improved but continues to have leg cramps on occasion -Started on Keppra 1 g twice daily, transition to oral -MRI brain: Cortical edema right parietal lobe without restricted diffusion or enhancement.  Related to recent seizure activity, repeat MRI in 2 to 3 months. -Status post lumbar puncture, culture shows no growth, Gram stain no organisms seen -Neurology consulted and appreciated  recommends MRI in 4 weeks and outpatient neurology follow-up -Continue seizure precautions -HIV pending   Acute rhabdomyolysis -Secondary to seizure -CK level on admission 9312 -CK level 8941 -Will increase IV fluids, 200 cc/h, and continue to monitor  Essential hypertension -BP Stable  Elevated LFTs -Possibly secondary to the above versus alcoholism -Abd [place]- normal RUQ [place] -Continue IV fluids and monitor CMP  Alcohol abuse -Discussed Cessation -Continue seizure precautions, alcohol withdrawal precautions with CIWA protocol, vitamin, folic acid, thiamine  Suicidal ideations -Psychiatry consulted and appreciated, no further complaints of suicidal homicidal thoughts  DVT Prophylaxis  SCDs  Code Status: Full  Family Communication: Family via phone  Disposition Plan: Admitted.  Pending improvement and CK levels.  Position to home  Consultants Neurology   Procedures  Abdominal ultrasound Lumbar puncture  Antibiotics   Anti-infectives (From admission, onward)   None      Subjective:   Jeremy Lane seen and examined today.  Patient upset and cursing this morning that he cannot go home.  He denies chest pain, shortness of breath, abdominal pain, nausea or vomiting, diarrhea constipation, dizziness or headache. Objective:   Vitals:   12/31/18 0356 12/31/18 0737 12/31/18 1041 12/31/18 1235  BP: 112/83 106/75 112/82 116/77  Pulse: (!) 54 66 (!) 55 70  Resp: 16 18  18   Temp: (!) 97.4 F (36.3 C) 98 F (36.7 C)  97.8 F (36.6 C)  TempSrc: Oral Oral  Oral  SpO2: 100% 96%  100%  Weight:      Height:        Intake/Output Summary (Last 24 hours) at 12/31/2018 1404 Last  data filed at 12/31/2018 1218 Gross per 24 hour  Intake 120 ml  Output 3745 ml  Net -3625 ml   Filed Weights   12/27/18 0228  Weight: 64.9 kg    Exam  General: Well developed, well nourished, NAD, appears stated age  HEENT: NCAT, mucous membranes moist.   Neck: Supple   Cardiovascular: S1 S2 auscultated, no rubs, murmurs or gallops. Regular rate and rhythm.  Respiratory: Clear to auscultation bilaterally with equal chest rise  Abdomen: Soft, nontender, nondistended, + bowel sounds  Extremities: warm dry without cyanosis clubbing or edema  Neuro: AAOx3, nonfocal  Psych: Angry   Data Reviewed: I have personally reviewed following labs and imaging studies  CBC: Recent Labs  Lab 12/27/18 0538 12/28/18 0701  WBC 3.2* 3.4*  NEUTROABS 0.8*  --   HGB 12.4* 12.5*  HCT 38.1* 38.7*  MCV 84.9 84.3  PLT 176 160   Basic Metabolic Panel: Recent Labs  Lab 12/27/18 0538 12/28/18 0701 12/31/18 0402  NA 136 139 140  K 3.8 3.9 3.8  CL 101 108 107  CO2 GLUCOSE 85 91 90  BUN CREATININE 0.42* 0.46* 0.43*  CALCIUM 9.4 8.9 9.2  MG 1.9  --   --   PHOS 4.5  --   --    GFR: Estimated Creatinine Clearance: 129.6 mL/min (A) (by C-G formula based on SCr of 0.43 mg/dL (L)). Liver Function Tests: Recent Labs  Lab 12/27/18 0538 12/28/18 0701  AST 199* 148*  ALT 187* 156*  ALKPHOS 39 36*  BILITOT 0.6 0.6  PROT 7.5 7.2  ALBUMIN 3.5 3.1*   No results for input(s): LIPASE, AMYLASE in the last 168 hours. No results for input(s): AMMONIA in the last 168 hours. Coagulation Profile: No results for input(s): INR, PROTIME in the last 168 hours. Cardiac Enzymes: Recent Labs  Lab 12/28/18 1808 12/30/18 0930 12/31/18 0402  CKTOTAL 9,312* 8,645* 8,941*   BNP (last 3 results) No results for input(s): PROBNP in the last 8760 hours. HbA1C: No results for input(s): HGBA1C in the last 72 hours. CBG: Recent Labs  Lab 12/27/18 0518  GLUCAP 89   Lipid Profile: No results for input(s): CHOL, HDL, LDLCALC, TRIG, CHOLHDL, LDLDIRECT in the last 72 hours. Thyroid Function Tests: No results for input(s): TSH, T4TOTAL, FREET4, T3FREE, THYROIDAB in the last 72 hours. Anemia Panel: No results for input(s): VITAMINB12, FOLATE, FERRITIN, TIBC,  IRON, RETICCTPCT in the last 72 hours. Urine analysis:    Component Value Date/Time   COLORURINE YELLOW 12/22/2018 1111   APPEARANCEUR CLEAR 12/22/2018 1111   LABSPEC 1.021 12/22/2018 1111   PHURINE 5.0 12/22/2018 1111   GLUCOSEU NEGATIVE 12/22/2018 1111   HGBUR MODERATE (A) 12/22/2018 1111   BILIRUBINUR NEGATIVE 12/22/2018 1111   KETONESUR NEGATIVE 12/22/2018 1111   PROTEINUR NEGATIVE 12/22/2018 1111   NITRITE NEGATIVE 12/22/2018 1111   LEUKOCYTESUR NEGATIVE 12/22/2018 1111   Sepsis Labs: (procalcitonin:4,lacticidven:4)  ) Recent Results (from the past 240 hour(s))  SARS Coronavirus 2 (CEPHEID - Performed in [facility] hospital lab), Hosp Order     Status: None   Collection Time: 12/27/18  8:03 AM  Result Value Ref Range Status   SARS Coronavirus 2 NEGATIVE NEGATIVE Final    Comment: (NOTE) If result is NEGATIVE SARS-CoV-2 target nucleic acids are NOT DETECTED. The SARS-CoV-2 RNA is generally detectable in upper and lower  respiratory specimens during the acute phase of infection. The lowest  concentration of SARS-CoV-2 viral copies  this assay can detect is 250  copies / mL. A negative result does not preclude SARS-CoV-2 infection  and should not be used as the sole basis for treatment or other  patient management decisions.  A negative result may occur with  improper specimen collection / handling, submission of specimen other  than nasopharyngeal swab, presence of viral mutation(s) within the  areas targeted by this assay, and inadequate number of viral copies  (<250 copies / mL). A negative result must be combined with clinical  observations, patient history, and epidemiological information. If result is POSITIVE SARS-CoV-2 target nucleic acids are DETECTED. The SARS-CoV-2 RNA is generally detectable in upper and lower  respiratory specimens dur ing the acute phase of infection.  Positive  results are indicative of active infection with SARS-CoV-2.  Clinical   correlation with patient history and other diagnostic information is  necessary to determine patient infection status.  Positive results do  not rule out bacterial infection or co-infection with other viruses. If result is PRESUMPTIVE POSTIVE SARS-CoV-2 nucleic acids MAY BE PRESENT.   A presumptive positive result was obtained on the submitted specimen  and confirmed on repeat testing.  While 2019 novel coronavirus  (SARS-CoV-2) nucleic acids may be present in the submitted sample  additional confirmatory testing may be necessary for epidemiological  and / or clinical management purposes  to differentiate between  SARS-CoV-2 and other Sarbecovirus currently known to infect humans.  If clinically indicated additional testing with an alternate test  methodology [phone]([id number]) is advised. The SARS-CoV-2 RNA is generally  detectable in upper and lower respiratory sp ecimens during the acute  phase of infection. The expected result is Negative. Fact Sheet for Patients:  BoilerBrush.com.cyhttps://www.fda.gov/media/136312/download Fact Sheet for Healthcare Providers: https://pope.com/https://www.fda.gov/media/136313/download This test is not yet approved or cleared by the [place] FDA and has been authorized for detection and/or diagnosis of SARS-CoV-2 by FDA under an Emergency Use Authorization (EUA).  This EUA will remain in effect (meaning this test can be used) for the duration of the COVID-19 declaration under Section 564(b)(1) of the Act, 21 U.S.C. section 360bbb-3(b)(1), unless the authorization is terminated or revoked sooner. Performed at [facility], [address] [place], [place] [address]   CSF culture     Status: None   Collection Time: 12/27/18  1:45 PM  Result Value Ref Range Status   Specimen Description CSF  Final   Special Requests NONE  Final   Gram Stain NO WBC SEEN NO ORGANISMS SEEN CYTOSPIN SMEAR   Final   Culture   Final    NO GROWTH 3 DAYS Performed at [facility], [address] [place], [place] [address]    Report Status 12/30/2018 FINAL  Final      Radiology Studies: No results found.   Scheduled Meds: . docusate sodium  100 mg Oral BID  . folic acid  1 mg Oral Daily  . levETIRAcetam  1,000 mg Oral BID  . multivitamin with minerals  1 tablet Oral Daily  . thiamine  100 mg Oral Daily   Continuous Infusions: . sodium chloride 200 mL/hr at 12/31/18 1217     LOS: 3 days   Time Spent in minutes   30 minutes  Daejah Klebba D.O. on 12/31/2018 at 2:04 PM  Between 7am to 7pm - Please see pager noted on [email or web address]  After 7pm go to [email or web address]  And look for the night coverage person covering for me after hours  [facility]  Office  [phone]

## 2019-01-01 ENCOUNTER — Encounter (HOSPITAL_COMMUNITY): Payer: Self-pay | Admitting: Infectious Diseases

## 2019-01-01 DIAGNOSIS — R569 Unspecified convulsions: Secondary | ICD-10-CM

## 2019-01-01 DIAGNOSIS — F1010 Alcohol abuse, uncomplicated: Secondary | ICD-10-CM

## 2019-01-01 DIAGNOSIS — F17210 Nicotine dependence, cigarettes, uncomplicated: Secondary | ICD-10-CM

## 2019-01-01 DIAGNOSIS — Z21 Asymptomatic human immunodeficiency virus [HIV] infection status: Secondary | ICD-10-CM

## 2019-01-01 DIAGNOSIS — G936 Cerebral edema: Secondary | ICD-10-CM

## 2019-01-01 LAB — LIPID PANEL
Cholesterol: 160 mg/dL (ref 0–200)
HDL: 32 mg/dL — ABNORMAL LOW (ref >40)
LDL Cholesterol: 116 mg/dL — ABNORMAL HIGH (ref 0–99)
Total CHOL/HDL Ratio: 5 RATIO
Triglycerides: 61 mg/dL (ref <150)
VLDL: 12 mg/dL (ref 0–40)

## 2019-01-01 LAB — COMPREHENSIVE METABOLIC PANEL
ALT: 112 U/L — ABNORMAL HIGH (ref 0–44)
AST: 116 U/L — ABNORMAL HIGH (ref 15–41)
Albumin: 3 g/dL — ABNORMAL LOW (ref 3.5–5.0)
Alkaline Phosphatase: 35 U/L — ABNORMAL LOW (ref 38–126)
Anion gap: 5 (ref 5–15)
BUN: 7 mg/dL (ref 6–20)
CO2: 24 mmol/L (ref 22–32)
Calcium: 8.8 mg/dL — ABNORMAL LOW (ref 8.9–10.3)
Chloride: 111 mmol/L (ref 98–111)
Creatinine, Ser: 0.47 mg/dL — ABNORMAL LOW (ref 0.61–1.24)
GFR calc Af Amer: >60 mL/min (ref >60)
GFR calc non Af Amer: >60 mL/min (ref >60)
Glucose, Bld: 85 mg/dL (ref 70–99)
Potassium: 4.1 mmol/L (ref 3.5–5.1)
Sodium: 140 mmol/L (ref 135–145)
Total Bilirubin: 0.6 mg/dL (ref 0.3–1.2)
Total Protein: 7.2 g/dL (ref 6.5–8.1)

## 2019-01-01 NOTE — Progress Notes (Signed)
Patient stated that his leg was starting to twitch again meaning that he was having a seizure. Started IV and gave IV ativan per order. Patient agreeable to starting IV and IV fluids.

## 2019-01-01 NOTE — TOC Progression Note (Signed)
Transition of Care [facility]) - Progression Note    Patient Details  Name: Morgan Mclane MRN: [id number] Date of Birth: [birth year]  Transition of Care [facility]) CM/SW Contact  Mearl Latin, LCSW Phone Number: 01/01/2019, 1:39 PM  Clinical Narrative:    Patient requested resources for support. CSW added resource to AVS.    Expected Discharge Plan: Home w Home Health Services Barriers to Discharge: Inadequate or no insurance(No PCP)  Expected Discharge Plan and Services Expected Discharge Plan: Home w Home Health Services     Post Acute Care Choice: Home Health, Durable Medical Equipment                   DME Arranged: 3-N-1, [place] DME Agency: [employer] Date DME Agency Contacted: 12/30/18 Time DME Agency Contacted: [phone] Representative spoke with at DME Agency: [facility]ack HH Arranged: PT, OT HH Agency: [facility](Charity services) Date [facility] Agency Contacted: 12/30/18 Time HH Agency Contacted: [phone] Representative spoke with at [facility] Agency: Kandee Keen   Social Determinants of Health (SDOH) Interventions    Readmission Risk Interventions No flowsheet data found.

## 2019-01-01 NOTE — Consult Note (Signed)
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 Check his labs (CD4, HIV RNA and genotype, HLA, RPR, gc/chlamydia) 2. Pharmacy is discussing his med options 3. Add CSF- CMV, EBV, JC virus. 4. Will see him in ID clinic ASAP 5. He can be d/c when ok with neuro and TRH 6. Discussed u=u with patient (risk of transmission to fiance when he is on meds) and also that his fiance could be on PREP.   Thank you so much for this interesting consult,  Principal Problem:   Seizure ([place]) Active Problems:   Essential hypertension   Elevated LFTs   Polysubstance abuse ([place])   Adjustment disorder with disturbance of emotion   Adjustment disorder with disturbance of emotion   . docusate sodium  100 mg Oral BID  . folic acid  1 mg Oral Daily  . levETIRAcetam  1,000 mg Oral BID  . multivitamin with minerals  1 tablet Oral Daily  . thiamine  100 mg Oral Daily    HPI: Jeremy Lane is a 23 y.o. male with hx of ETOH abuse, marijuana use, adm on 5-23 with localized sz activity in his LLE for 5 days PTA.  He was eval by neruo, started on keppra. His MRI did show R parietal lobe edema (? Post ictal vs low grade tumor).  He had LP showing no WBC.  He had HIV testing in hospital and this is now found to be positive.   HIV risk- MSM. He is engaged but had new partner in last 6 months.    Review of Systems: Review of Systems  Constitutional: Negative for weight loss.  HENT: Negative for sore throat.        No oral ulcers/sores  Gastrointestinal: Negative for constipation and diarrhea.  Genitourinary: Negative for dysuria.  Neurological: Positive for sensory change and seizures.    Past Medical History:  Diagnosis Date  . Hypertension     Social History   Tobacco Use  .  Smoking status: Current Every Day Smoker    Packs/day: 0.20    Types: Cigarettes  . Smokeless tobacco: Never Used  Substance Use Topics  . Alcohol use: Yes    Comment: states he has been drinking more lately because he is stressed  . Drug use: Yes    Types: Marijuana, Cocaine    Comment: cocaine "once in a blue moon"    No family history on file.   Medications:  Scheduled: . docusate sodium  100 mg Oral BID  . folic acid  1 mg Oral Daily  . levETIRAcetam  1,000 mg Oral BID  . multivitamin with minerals  1 tablet Oral Daily  . thiamine  100 mg Oral Daily    Abtx:  Anti-infectives (From admission, onward)   None        OBJECTIVE: Blood pressure 137/89, pulse (!) 59, temperature 98 F (36.7 C), temperature source Oral, resp. rate 18, height _0  (1.676 m), weight 64.9 kg, SpO2 100 %.  Physical Exam Constitutional:      General: He is not in acute distress.    Appearance: Normal appearance. He is normal weight. He is not ill-appearing.  HENT:  Mouth/Throat:     Mouth: Mucous membranes are moist.     Pharynx: No oropharyngeal exudate.  Eyes:     Extraocular Movements: Extraocular movements intact.     Pupils: Pupils are equal, round, and reactive to light.  Neck:     Musculoskeletal: Normal range of motion and neck supple.  Cardiovascular:     Rate and Rhythm: Normal rate and regular rhythm.  Pulmonary:     Effort: Pulmonary effort is normal.     Breath sounds: Normal breath sounds.  Abdominal:     General: Abdomen is flat. Bowel sounds are normal. There is no distension.     Palpations: Abdomen is soft.     Tenderness: There is no abdominal tenderness.  Musculoskeletal:     Right lower leg: No edema.     Left lower leg: No edema.  Neurological:     General: No focal deficit present.     Mental Status: He is alert and oriented to person, place, and time.     Lab Results Results for orders placed or performed during the hospital encounter of 12/27/18  (from the past 48 hour(s))  CK     Status: Abnormal   Collection Time: 12/31/18  4:02 AM  Result Value Ref Range   Total CK 8,941 (H) 49 - 397 U/L    Comment: RESULTS CONFIRMED BY MANUAL DILUTION Performed at [facility], [address], [place], [place] [address]   Basic metabolic panel     Status: Abnormal   Collection Time: 12/31/18  4:02 AM  Result Value Ref Range   Sodium 140 135 - 145 mmol/L   Potassium 3.8 3.5 - 5.1 mmol/L   Chloride 107 98 - 111 mmol/L   CO2 24 22 - 32 mmol/L   Glucose, Bld 90 70 - 99 mg/dL   BUN 8 6 - 20 mg/dL   Creatinine, Ser 0.43 (L) 0.61 - 1.24 mg/dL   Calcium 9.2 8.9 - 10.3 mg/dL   GFR calc non Af Amer >60 >60 mL/min   GFR calc Af Amer >60 >60 mL/min   Anion gap 9 5 - 15    Comment: Performed at [facility], [place] [address] [place], [place] [address]  Comprehensive metabolic panel     Status: Abnormal   Collection Time: 01/01/19  4:12 AM  Result Value Ref Range   Sodium 140 135 - 145 mmol/L   Potassium 4.1 3.5 - 5.1 mmol/L   Chloride 111 98 - 111 mmol/L   CO2 24 22 - 32 mmol/L   Glucose, Bld 85 70 - 99 mg/dL   BUN 7 6 - 20 mg/dL   Creatinine, Ser 0.47 (L) 0.61 - 1.24 mg/dL   Calcium 8.8 (L) 8.9 - 10.3 mg/dL   Total Protein 7.2 6.5 - 8.1 g/dL   Albumin 3.0 (L) 3.5 - 5.0 g/dL   AST 116 (H) 15 - 41 U/L   ALT 112 (H) 0 - 44 U/L   Alkaline Phosphatase 35 (L) 38 - 126 U/L   Total Bilirubin 0.6 0.3 - 1.2 mg/dL   GFR calc non Af Amer >60 >60 mL/min   GFR calc Af Amer >60 >60 mL/min   Anion gap 5 5 - 15    Comment: Performed at [facility], [place] [address], [place], [place] [address]  CK     Status: Abnormal   Collection Time: 01/01/19  4:12 AM  Result Value Ref Range   Total CK  8,143 (H) 49 - 397 U/L    Comment: Performed at [facility], [place] [address] [place], [place] [address]      Component Value Date/Time   SDES CSF 12/27/2018 1345   SPECREQUEST NONE 12/27/2018 1345   CULT  12/27/2018 1345    NO GROWTH 3  DAYS Performed at [facility], [place] [address], [place], [place] [address]    REPTSTATUS 12/30/2018 FINAL 12/27/2018 1345   No results found. Recent Results (from the past 240 hour(s))  SARS Coronavirus 2 (CEPHEID - Performed in [facility] hospital lab), Hosp Order     Status: None   Collection Time: 12/27/18  8:03 AM  Result Value Ref Range Status   SARS Coronavirus 2 NEGATIVE NEGATIVE Final    Comment: (NOTE) If result is NEGATIVE SARS-CoV-2 target nucleic acids are NOT DETECTED. The SARS-CoV-2 RNA is generally detectable in upper and lower  respiratory specimens during the acute phase of infection. The lowest  concentration of SARS-CoV-2 viral copies this assay can detect is 250  copies / mL. A negative result does not preclude SARS-CoV-2 infection  and should not be used as the sole basis for treatment or other  patient management decisions.  A negative result may occur with  improper specimen collection / handling, submission of specimen other  than nasopharyngeal swab, presence of viral mutation(s) within the  areas targeted by this assay, and inadequate number of viral copies  (<250 copies / mL). A negative result must be combined with clinical  observations, patient history, and epidemiological information. If result is POSITIVE SARS-CoV-2 target nucleic acids are DETECTED. The SARS-CoV-2 RNA is generally detectable in upper and lower  respiratory specimens dur ing the acute phase of infection.  Positive  results are indicative of active infection with SARS-CoV-2.  Clinical  correlation with patient history and other diagnostic information is  necessary to determine patient infection status.  Positive results do  not rule out bacterial infection or co-infection with other viruses. If result is PRESUMPTIVE POSTIVE SARS-CoV-2 nucleic acids MAY BE PRESENT.   A presumptive positive result was obtained on the submitted specimen  and confirmed on repeat testing.  While  2019 novel coronavirus  (SARS-CoV-2) nucleic acids may be present in the submitted sample  additional confirmatory testing may be necessary for epidemiological  and / or clinical management purposes  to differentiate between  SARS-CoV-2 and other Sarbecovirus currently known to infect humans.  If clinically indicated additional testing with an alternate test  methodology [phone]) is advised. The SARS-CoV-2 RNA is generally  detectable in upper and lower respiratory sp ecimens during the acute  phase of infection. The expected result is Negative. Fact Sheet for Patients:  StrictlyIdeas.no Fact Sheet for Healthcare Providers: BankingDealers.co.za This test is not yet approved or cleared by the [place] FDA and has been authorized for detection and/or diagnosis of SARS-CoV-2 by FDA under an Emergency Use Authorization (EUA).  This EUA will remain in effect (meaning this test can be used) for the duration of the COVID-19 declaration under Section 564(b)(1) of the Act, 21 U.S.C. section 360bbb-3(b)(1), unless the authorization is terminated or revoked sooner. Performed at [facility], [place] [address], [place], [place] [address]   CSF culture     Status: None   Collection Time: 12/27/18  1:45 PM  Result Value Ref Range Status   Specimen Description CSF  Final   Special Requests NONE  Final   Gram Stain NO WBC  SEEN NO ORGANISMS SEEN CYTOSPIN SMEAR   Final   Culture   Final    NO GROWTH 3 DAYS Performed at [facility], [place] [address] [place], [place] [address]    Report Status 12/30/2018 FINAL  Final    Microbiology: Recent Results (from the past 240 hour(s))  SARS Coronavirus 2 (CEPHEID - Performed in [facility] hospital lab), Hosp Order     Status: None   Collection Time: 12/27/18  8:03 AM  Result Value Ref Range Status   SARS Coronavirus 2 NEGATIVE NEGATIVE Final    Comment: (NOTE) If result is NEGATIVE  SARS-CoV-2 target nucleic acids are NOT DETECTED. The SARS-CoV-2 RNA is generally detectable in upper and lower  respiratory specimens during the acute phase of infection. The lowest  concentration of SARS-CoV-2 viral copies this assay can detect is 250  copies / mL. A negative result does not preclude SARS-CoV-2 infection  and should not be used as the sole basis for treatment or other  patient management decisions.  A negative result may occur with  improper specimen collection / handling, submission of specimen other  than nasopharyngeal swab, presence of viral mutation(s) within the  areas targeted by this assay, and inadequate number of viral copies  (<250 copies / mL). A negative result must be combined with clinical  observations, patient history, and epidemiological information. If result is POSITIVE SARS-CoV-2 target nucleic acids are DETECTED. The SARS-CoV-2 RNA is generally detectable in upper and lower  respiratory specimens dur ing the acute phase of infection.  Positive  results are indicative of active infection with SARS-CoV-2.  Clinical  correlation with patient history and other diagnostic information is  necessary to determine patient infection status.  Positive results do  not rule out bacterial infection or co-infection with other viruses. If result is PRESUMPTIVE POSTIVE SARS-CoV-2 nucleic acids MAY BE PRESENT.   A presumptive positive result was obtained on the submitted specimen  and confirmed on repeat testing.  While 2019 novel coronavirus  (SARS-CoV-2) nucleic acids may be present in the submitted sample  additional confirmatory testing may be necessary for epidemiological  and / or clinical management purposes  to differentiate between  SARS-CoV-2 and other Sarbecovirus currently known to infect humans.  If clinically indicated additional testing with an alternate test  methodology [phone]) is advised. The SARS-CoV-2 RNA is generally  detectable in upper  and lower respiratory sp ecimens during the acute  phase of infection. The expected result is Negative. Fact Sheet for Patients:  StrictlyIdeas.no Fact Sheet for Healthcare Providers: BankingDealers.co.za This test is not yet approved or cleared by the [place] FDA and has been authorized for detection and/or diagnosis of SARS-CoV-2 by FDA under an Emergency Use Authorization (EUA).  This EUA will remain in effect (meaning this test can be used) for the duration of the COVID-19 declaration under Section 564(b)(1) of the Act, 21 U.S.C. section 360bbb-3(b)(1), unless the authorization is terminated or revoked sooner. Performed at [facility], [place] [address], [place], [place] [address]   CSF culture     Status: None   Collection Time: 12/27/18  1:45 PM  Result Value Ref Range Status   Specimen Description CSF  Final   Special Requests NONE  Final   Gram Stain NO WBC SEEN NO ORGANISMS SEEN CYTOSPIN SMEAR   Final   Culture   Final    NO GROWTH 3 DAYS Performed at [facility], [place] [address] [place], [place]  [address]    Report Status 12/30/2018 FINAL  Final    Radiographs and labs were personally reviewed by me.   Jeremy Rumpf, MD [facility] [phone] 01/01/2019, 11:09 AM

## 2019-01-01 NOTE — Progress Notes (Signed)
PROGRESS NOTE    Jeremy Lane  MWN:[id number] DOB: [birth year] DOA: 12/27/2018 PCP: Patient, No Pcp Per   Brief Narrative:  HPI On 12/27/2018 by Dr. Karmen Bongo Jeremy Lane is a 23 y.o. male with medical history significant of HTN presenting with unilateral leg twitching.  The patient was quite somnolent at the time of my evaluation and was able to answer only a few questions.  He denied fever.  HPI per Dr. Lorraine Lax:  Jeremy Lane is a 23 y.o. male with past medical history of hypertension, heavy alcohol abuse, marijuana use presents to the ED with continuous seizure-like activity since 5 days.  Per description from the EDP, left leg had continuous rhythmic activity from hip downwards. The patient states that this is been going on for about 5 days.  Patient was seen in the emergency department on 5/18 after presenting with possible seizure-like activity. He has some left pain and tingling followed by jerking. This happened just prior to him having to go to court. Patient was discharged from the ED with plan to follow-up with neurologist.  Patient states that since his discharge he has been having continuous jerking of the left leg that has persisted throughout the day. Patient was given 1 mg of Ativan and received a load of Dilantin.  On assessment, patient had received Dilantin and his leg jerking had stopped. Patient also started itching after receiving Dilantin.  Interim history Admitted for seizure and acute rhabdo. Increasing IVF. Assessment & Plan   Seizure -Possibly alcohol induced versus HIV encephalitis versus primary epileptic disorder -States twitching is improved but continues to have leg cramps on occasion -Started on Keppra 1 g twice daily, transition to oral -MRI brain: Cortical edema right parietal lobe without restricted diffusion or enhancement.  Related to recent seizure activity, repeat MRI in 2 to 3 months. -Status post lumbar puncture, culture shows no growth,  Gram stain no organisms seen -Neurology consulted and appreciated recommends MRI in 4 weeks and outpatient neurology follow-up -Continue seizure precautions -HIV 1Ab positive  Acute rhabdomyolysis -Secondary to seizure -CK level on admission 9312 -CK level mildly improved today to 8143 -Will increase IV fluids, 250 cc/h, and continue to monitor  Essential hypertension -BP Stable  Elevated LFTs -Possibly secondary to the above versus alcoholism -Abd [place]- normal RUQ [place] -LFTs improving  -Continue IV fluids and monitor CMP  Alcohol abuse -Discussed Cessation -Continue seizure precautions, alcohol withdrawal precautions with CIWA protocol, vitamin, folic acid, thiamine  Suicidal ideations -Psychiatry consulted and appreciated, no further complaints of suicidal homicidal thoughts  HIV positive -HIV-1 Ab positive -discussed with patient -Infectious disease consulted and appreciated- would like to check CD4, HIV RNA and genotype, HLA, RPR, gc/chlamydia, Add CSF (CMV, EBV, JC virus)  DVT Prophylaxis  SCDs  Code Status: Full  Family Communication: Family via phone  Disposition Plan: Admitted.  Pending improvement and CK levels.  Disposition to home  Consultants Neurology  Infectious disease  Procedures  Abdominal ultrasound Lumbar puncture  Antibiotics   Anti-infectives (From admission, onward)   None      Subjective:   [name] seen and examined today.  Patient with no complaints this morning.  Feels pain is improved.  Denies current chest pain, shortness breath, abdominal pain, nausea vomiting, diarrhea constipation, dizziness or headache. Objective:   Vitals:   12/31/18 1235 12/31/18 1553 12/31/18 2350 01/01/19 0821  BP: 116/77 115/78 124/81 137/89  Pulse: 70 78 (!) 53 (!) 59  Resp: _0 Temp:  97.8 F (36.6 C) 97.8 F (36.6 C) 97.8 F (36.6 C) 98 F (36.7 C)  TempSrc: Oral Oral Oral Oral  SpO2: 100% 100% 100% 100%  Weight:      Height:         Intake/Output Summary (Last 24 hours) at 01/01/2019 1147 Last data filed at 12/31/2018 2300 Gross per 24 hour  Intake 480 ml  Output 3200 ml  Net -2720 ml   Filed Weights   12/27/18 0228  Weight: 64.9 kg   Exam  General: Well developed, well nourished, NAD, appears stated age  39: NCAT, mucous membranes moist.   Cardiovascular: S1 S2 auscultated, no murmur, RRR  Respiratory: Clear to auscultation bilaterally with equal chest rise  Abdomen: Soft, nontender, nondistended, + bowel sounds  Extremities: warm dry without cyanosis clubbing or edema  Neuro: AAOx3, nonfocal  Psych: Appropriate   Data Reviewed: I have personally reviewed following labs and imaging studies  CBC: Recent Labs  Lab 12/27/18 0538 12/28/18 0701  WBC 3.2* 3.4*  NEUTROABS 0.8*  --   HGB 12.4* 12.5*  HCT 38.1* 38.7*  MCV 84.9 84.3  PLT 176 831   Basic Metabolic Panel: Recent Labs  Lab 12/27/18 0538 12/28/18 0701 12/31/18 0402 01/01/19 0412  NA 136 139 140 140  K 3.8 3.9 3.8 4.1  CL 101 108 107 111  CO2 _0 GLUCOSE 85 91 90 85  BUN _1 CREATININE 0.42* 0.46* 0.43* 0.47*  CALCIUM 9.4 8.9 9.2 8.8*  MG 1.9  --   --   --   PHOS 4.5  --   --   --    GFR: Estimated Creatinine Clearance: 129.6 mL/min (A) (by C-G formula based on SCr of 0.47 mg/dL (L)). Liver Function Tests: Recent Labs  Lab 12/27/18 0538 12/28/18 0701 01/01/19 0412  AST 199* 148* 116*  ALT 187* 156* 112*  ALKPHOS 39 36* 35*  BILITOT 0.6 0.6 0.6  PROT 7.5 7.2 7.2  ALBUMIN 3.5 3.1* 3.0*   No results for input(s): LIPASE, AMYLASE in the last 168 hours. No results for input(s): AMMONIA in the last 168 hours. Coagulation Profile: No results for input(s): INR, PROTIME in the last 168 hours. Cardiac Enzymes: Recent Labs  Lab 12/28/18 1808 12/30/18 0930 12/31/18 0402 01/01/19 0412  CKTOTAL 9,312* 5,176* 8,941* 8,143*   BNP (last 3 results) No results for input(s): PROBNP in the last 8760  hours. HbA1C: No results for input(s): HGBA1C in the last 72 hours. CBG: Recent Labs  Lab 12/27/18 0518  GLUCAP 89   Lipid Profile: No results for input(s): CHOL, HDL, LDLCALC, TRIG, CHOLHDL, LDLDIRECT in the last 72 hours. Thyroid Function Tests: No results for input(s): TSH, T4TOTAL, FREET4, T3FREE, THYROIDAB in the last 72 hours. Anemia Panel: No results for input(s): VITAMINB12, FOLATE, FERRITIN, TIBC, IRON, RETICCTPCT in the last 72 hours. Urine analysis:    Component Value Date/Time   COLORURINE YELLOW 12/22/2018 1111   APPEARANCEUR CLEAR 12/22/2018 1111   LABSPEC 1.021 12/22/2018 1111   PHURINE 5.0 12/22/2018 1111   GLUCOSEU NEGATIVE 12/22/2018 1111   HGBUR MODERATE (A) 12/22/2018 1111   BILIRUBINUR NEGATIVE 12/22/2018 1111   KETONESUR NEGATIVE 12/22/2018 1111   PROTEINUR NEGATIVE 12/22/2018 1111   NITRITE NEGATIVE 12/22/2018 1111   LEUKOCYTESUR NEGATIVE 12/22/2018 1111   Sepsis Labs: _2 (procalcitonin:4,lacticidven:4)  ) Recent Results (from the past 240 hour(s))  SARS Coronavirus 2 (CEPHEID - Performed in [place] hospital lab), [facility]  Status: None   Collection Time: 12/27/18  8:03 AM  Result Value Ref Range Status   SARS Coronavirus 2 NEGATIVE NEGATIVE Final    Comment: (NOTE) If result is NEGATIVE SARS-CoV-2 target nucleic acids are NOT DETECTED. The SARS-CoV-2 RNA is generally detectable in upper and lower  respiratory specimens during the acute phase of infection. The lowest  concentration of SARS-CoV-2 viral copies this assay can detect is 250  copies / mL. A negative result does not preclude SARS-CoV-2 infection  and should not be used as the sole basis for treatment or other  patient management decisions.  A negative result may occur with  improper specimen collection / handling, submission of specimen other  than nasopharyngeal swab, presence of viral mutation(s) within the  areas targeted by this assay, and inadequate number of  viral copies  (<250 copies / mL). A negative result must be combined with clinical  observations, patient history, and epidemiological information. If result is POSITIVE SARS-CoV-2 target nucleic acids are DETECTED. The SARS-CoV-2 RNA is generally detectable in upper and lower  respiratory specimens dur ing the acute phase of infection.  Positive  results are indicative of active infection with SARS-CoV-2.  Clinical  correlation with patient history and other diagnostic information is  necessary to determine patient infection status.  Positive results do  not rule out bacterial infection or co-infection with other viruses. If result is PRESUMPTIVE POSTIVE SARS-CoV-2 nucleic acids MAY BE PRESENT.   A presumptive positive result was obtained on the submitted specimen  and confirmed on repeat testing.  While 2019 novel coronavirus  (SARS-CoV-2) nucleic acids may be present in the submitted sample  additional confirmatory testing may be necessary for epidemiological  and / or clinical management purposes  to differentiate between  SARS-CoV-2 and other Sarbecovirus currently known to infect humans.  If clinically indicated additional testing with an alternate test  methodology [phone]) is advised. The SARS-CoV-2 RNA is generally  detectable in upper and lower respiratory sp ecimens during the acute  phase of infection. The expected result is Negative. Fact Sheet for Patients:  StrictlyIdeas.no Fact Sheet for Healthcare Providers: BankingDealers.co.za This test is not yet approved or cleared by the [place] FDA and has been authorized for detection and/or diagnosis of SARS-CoV-2 by FDA under an Emergency Use Authorization (EUA).  This EUA will remain in effect (meaning this test can be used) for the duration of the COVID-19 declaration under Section 564(b)(1) of the Act, 21 U.S.C. section 360bbb-3(b)(1), unless the authorization is  terminated or revoked sooner. Performed at [facility], [place] [address], [place], [place] [address]   CSF culture     Status: None   Collection Time: 12/27/18  1:45 PM  Result Value Ref Range Status   Specimen Description CSF  Final   Special Requests NONE  Final   Gram Stain NO WBC SEEN NO ORGANISMS SEEN CYTOSPIN SMEAR   Final   Culture   Final    NO GROWTH 3 DAYS Performed at [facility], [place] [address], [place], [place] [address]    Report Status 12/30/2018 FINAL  Final      Radiology Studies: No results found.   Scheduled Meds: . docusate sodium  100 mg Oral BID  . folic acid  1 mg Oral Daily  . levETIRAcetam  1,000 mg Oral BID  . multivitamin with minerals  1 tablet Oral Daily  . thiamine  100 mg Oral Daily   Continuous Infusions: . sodium  chloride 200 mL/hr at 12/31/18 1853     LOS: 4 days   Time Spent in minutes   30 minutes  Myranda Pavone D.O. on 01/01/2019 at 11:47 AM  Between 7am to 7pm - Please see pager noted on [email or web address]  After 7pm go to [email or web address]  And look for the night coverage person covering for me after hours  [facility] Office  [phone]

## 2019-01-01 NOTE — Progress Notes (Signed)
Patient refusing bed alarm.  CN RN educated patient on the importance of bed alarm for his safety and especially due to him being treated for seizures.  Patient advised that we can not take away his independence away.  RN explained that is not the intentions, that it is totally for his safety.  Patient advised that he does not care and that he fully understood.

## 2019-01-01 NOTE — Progress Notes (Signed)
Pt refusing fluids, removed IV. Stated he will call when RN can come replace IV.

## 2019-01-01 NOTE — Progress Notes (Signed)
Patient stated he does not want IV access at this time. RN educated on need to have access in order to give IV fluids and seizure medications in case of emergency. Patient able to verbalize understanding and teach back, still refusing IV access. MD aware. Will continue to monitor and encourage patient to allow RN to place IV. Pt stated he may want an IV at a later time.

## 2019-01-01 NOTE — Progress Notes (Signed)
Patient refusing telemetry, RN educated on need for tele due to seizures. Verbalized understanding, still refuses. Patient is also refusing seizure pads. Educated on need for seizure pads because of active seizures. Patient verbalized understanding but is refusing. Will continue to emphasize importance of tele and seizure pads.

## 2019-01-01 NOTE — Progress Notes (Signed)
Occupational Therapy Treatment Patient Details Name: Jeremy Lane MRN: [id number] DOB: [birth year] Today's Date: 01/01/2019    History of present illness Pt is a 23 y/o male admitted secondary to seizure like activity. Workup in process. MRI revealed Cortical edema high right parietal lobe. Differential includes right temporal lobe-seizure edema versus low-grade tumor versus HIV encephalitis. Pt also with suicidal thoughts and had psychiatry evaluation. PMH includes HTN, alcohol abuse, drug abuse.    OT comments  Patient was seated on EOB upon therapy arrival and agreeable to participate in OT treatment session. Patient tearful during session stating that he just found out he is HIV+. He was distracted by recent news and discussed his mother and his family and how they are handling the news. Although distracted, patient was able to participate in treatment session with VC to return attention to task as needed. Emotional support provided as needed. At this time, patient has met all therapy goals and will be discharged from therapy services. No follow OT is recommended at this time as patient has available family support and has returned to close to baseline. He is aware of his slight imbalance when ambulating and navigating in the environment and is independent with management.   Follow Up Recommendations  No OT follow up    Equipment Recommendations  None recommended by OT       Precautions / Restrictions Precautions Precautions: Fall Restrictions Weight Bearing Restrictions: No       Mobility Bed Mobility Overal bed mobility: Independent    Transfers Overall transfer level: Independent Equipment used: None     Balance Overall balance assessment: Independent Sitting-balance support: No upper extremity supported;Feet supported Sitting balance-Leahy Scale: Good     Standing balance support: No upper extremity supported;During functional activity Standing balance-Leahy Scale:  Fair Standing balance comment: patient reports that he feels like he needs to use a cane although none used during session. No LOB or imbalance noted. patient was able to safely navigate throughout room.       ADL either performed or assessed with clinical judgement   ADL Overall ADL's : Modified independent     Grooming: Wash/dry hands;Wash/dry face;Oral care;Modified independent;Standing   Upper Body Bathing: Modified independent;Sitting   Lower Body Bathing: Modified independent;Sit to/from stand   Upper Body Dressing : Sitting;Independent   Lower Body Dressing: Modified independent;Sit to/from stand   Toilet Transfer: Independent;Ambulation;Regular Toilet           Functional mobility during ADLs: Modified independent General ADL Comments: Pt demonstrated no LOB or imbalance during session. Did utilize sink to lean against when standing at sink.      Vision Baseline Vision/History: No visual deficits Patient Visual Report: No change from baseline            Cognition Arousal/Alertness: Awake/alert Behavior During Therapy: WFL for tasks assessed/performed Overall Cognitive Status: Within Functional Limits for tasks assessed                  Pertinent Vitals/ Pain       Pain Assessment: No/denies pain      Progress Toward Goals  OT Goals(current goals can now be found in the care plan section)  Progress towards OT goals: Goals met/education completed, patient discharged from [name] All goals met and education completed, patient discharged from [name] OT "6 Clicks" Daily Activity     Outcome Measure   Help from another person eating meals?:  None Help from another person taking care of personal grooming?: None Help from another person toileting, which includes using toliet, bedpan, or urinal?: None Help from another person bathing (including washing, rinsing, drying)?: None Help from another person to put on and taking off  regular upper body clothing?: None Help from another person to put on and taking off regular lower body clothing?: None 6 Click Score: 24    End of Session    OT Visit Diagnosis: Other abnormalities of gait and mobility (R26.89);Other symptoms and signs involving the nervous system (R29.898)   Activity Tolerance Patient tolerated treatment well   Patient Left in chair;with call bell/phone within reach           Time: 1130-1200 OT Time Calculation (min): 30 min  Charges: OT General Charges $OT Visit: 1 Visit OT Treatments $Self Care/Home Management : 23-37 mins(30')  Ailene Ravel, OTR/L,CBIS  [phone]    Maher Shon, Clarene Duke 01/01/2019, 12:35 PM

## 2019-01-02 ENCOUNTER — Encounter: Payer: Self-pay | Admitting: Infectious Diseases

## 2019-01-02 DIAGNOSIS — G9340 Encephalopathy, unspecified: Secondary | ICD-10-CM

## 2019-01-02 DIAGNOSIS — Z888 Allergy status to other drugs, medicaments and biological substances status: Secondary | ICD-10-CM

## 2019-01-02 DIAGNOSIS — B20 Human immunodeficiency virus [HIV] disease: Principal | ICD-10-CM

## 2019-01-02 DIAGNOSIS — G40201 Localization-related (focal) (partial) symptomatic epilepsy and epileptic syndromes with complex partial seizures, not intractable, with status epilepticus: Secondary | ICD-10-CM

## 2019-01-02 LAB — COMPREHENSIVE METABOLIC PANEL
ALT: 123 U/L — ABNORMAL HIGH (ref 0–44)
AST: 162 U/L — ABNORMAL HIGH (ref 15–41)
Albumin: 3 g/dL — ABNORMAL LOW (ref 3.5–5.0)
Alkaline Phosphatase: 36 U/L — ABNORMAL LOW (ref 38–126)
Anion gap: 9 (ref 5–15)
BUN: 7 mg/dL (ref 6–20)
CO2: 22 mmol/L (ref 22–32)
Calcium: 9 mg/dL (ref 8.9–10.3)
Chloride: 107 mmol/L (ref 98–111)
Creatinine, Ser: 0.4 mg/dL — ABNORMAL LOW (ref 0.61–1.24)
GFR calc Af Amer: >60 mL/min (ref >60)
GFR calc non Af Amer: >60 mL/min (ref >60)
Glucose, Bld: 111 mg/dL — ABNORMAL HIGH (ref 70–99)
Potassium: 3.2 mmol/L — ABNORMAL LOW (ref 3.5–5.1)
Sodium: 138 mmol/L (ref 135–145)
Total Bilirubin: 0.4 mg/dL (ref 0.3–1.2)
Total Protein: 6.6 g/dL (ref 6.5–8.1)

## 2019-01-02 LAB — CK
Total CK: 8143 U/L — ABNORMAL HIGH (ref 49–397)
Total CK: 11157 U/L — ABNORMAL HIGH (ref 49–397)

## 2019-01-02 LAB — HIV 1/2 AB DIFFERENTIATION
HIV 1 Ab: POSITIVE — AB
HIV 2 Ab: NEGATIVE

## 2019-01-02 LAB — T-HELPER CELLS (CD4) COUNT (NOT AT ARMC)
CD4 % Helper T Cell: 16 % — ABNORMAL LOW (ref 33–65)
CD4 T Cell Abs: 304 /uL — ABNORMAL LOW (ref 400–1790)

## 2019-01-02 LAB — RPR: RPR Ser Ql: NONREACTIVE

## 2019-01-02 LAB — HIV ANTIBODY (ROUTINE TESTING W REFLEX): HIV Screen 4th Generation wRfx: REACTIVE — AB

## 2019-01-02 MED ORDER — POTASSIUM CHLORIDE CRYS ER 20 MEQ PO TBCR
40.0000 meq | EXTENDED_RELEASE_TABLET | Freq: Once | ORAL | Status: AC
  Administered 2019-01-02: 40 meq via ORAL
  Filled 2019-01-02: qty 2

## 2019-01-02 NOTE — Progress Notes (Signed)
PROGRESS NOTE    Jeremy Lane  ESP:[id number] DOB: 11/05/1995 DOA: 12/27/2018 PCP: Patient, No Pcp Per   Brief Narrative:  HPI On 12/27/2018 by Dr. Karmen Bongo Creighton Jeremy Lane is a 23 y.o. male with medical history significant of HTN presenting with unilateral leg twitching.  The patient was quite somnolent at the time of my evaluation and was able to answer only a few questions.  He denied fever.  HPI per Dr. Lorraine Lax:  Jeremy Lane is a 23 y.o. male with past medical history of hypertension, heavy alcohol abuse, marijuana use presents to the ED with continuous seizure-like activity since 5 days.  Per description from the EDP, left leg had continuous rhythmic activity from hip downwards. The patient states that this is been going on for about 5 days.  Patient was seen in the emergency department on 5/18 after presenting with possible seizure-like activity. He has some left pain and tingling followed by jerking. This happened just prior to him having to go to court. Patient was discharged from the ED with plan to follow-up with neurologist.  Patient states that since his discharge he has been having continuous jerking of the left leg that has persisted throughout the day. Patient was given 1 mg of Ativan and received a load of Dilantin.  On assessment, patient had received Dilantin and his leg jerking had stopped. Patient also started itching after receiving Dilantin.  Interim history Admitted for seizure and acute rhabdo. Increasing IVF. Assessment & Plan   Seizure -Possibly alcohol induced versus HIV encephalitis versus primary epileptic disorder -States twitching is improved but continues to have leg cramps on occasion -Started on Keppra 1 g twice daily, transition to oral -MRI brain: Cortical edema right parietal lobe without restricted diffusion or enhancement.  Related to recent seizure activity, repeat MRI in 2 to 3 months. -Status post lumbar puncture, culture shows no growth,  Gram stain no organisms seen -Neurology consulted and appreciated recommends MRI in 4 weeks and outpatient neurology follow-up -Continue seizure precautions -HIV 1Ab positive  Acute rhabdomyolysis -Secondary to seizure -CK level on admission 9312 -CK level worsened today, 11,157 -Continue 250 cc/h, and continue to monitor -it seems that patient had lost IV access on 5/28 and had refused to have it replaced  Essential hypertension -BP Stable  Elevated LFTs -Possibly secondary to the above versus alcoholism -Abd [place]- normal RUQ [place] -Continue IV fluids and monitor CMP  Alcohol abuse -Discussed Cessation -Continue seizure precautions, alcohol withdrawal precautions with CIWA protocol, vitamin, folic acid, thiamine  Suicidal ideations -Psychiatry consulted and appreciated, no further complaints of suicidal homicidal thoughts  HIV positive -HIV-1 Ab positive -discussed with patient -Infectious disease consulted and appreciated- would like to check CD4, HIV RNA and genotype, HLA, RPR, gc/chlamydia, Add CSF (CMV, EBV, JC virus)  DVT Prophylaxis  SCDs  Code Status: Full  Family Communication: None at bedside  Disposition Plan: Admitted.  Pending improvement and CK levels.  Disposition to home  Consultants Neurology  Infectious disease  Procedures  Abdominal ultrasound Lumbar puncture  Antibiotics   Anti-infectives (From admission, onward)   None      Subjective:   [name] seen and examined today.  No complaints today. Wants to know more about his diagnosis of HIV. Denies current chest pain, shortness of breath, abdominal pain, N/V/D/C.  Objective:   Vitals:   01/01/19 2350 01/02/19 0413 01/02/19 0916 01/02/19 1159  BP: (!) 136/97 (!) 137/98 128/83 120/78  Pulse: (!) 57 73 (!) 56 78  Resp: _0 Temp: 98.2 F (36.8 C) (!) 97.3 F (36.3 C) (!) 97.5 F (36.4 C) 98.2 F (36.8 C)  TempSrc: Oral Oral Oral Oral  SpO2: 100% 97% 100% 99%  Weight:       Height:        Intake/Output Summary (Last 24 hours) at 01/02/2019 1240 Last data filed at 01/01/2019 1800 Gross per 24 hour  Intake -  Output 525 ml  Net -525 ml   Filed Weights   12/27/18 0228  Weight: 64.9 kg   Exam (unchanged from 5/28)  General: Well developed, well nourished, NAD, appears stated age  HEENT: NCAT, mucous membranes moist.   Cardiovascular: S1 S2 auscultated, no murmur, RRR  Respiratory: Clear to auscultation bilaterally with equal chest rise  Abdomen: Soft, nontender, nondistended, + bowel sounds  Extremities: warm dry without cyanosis clubbing or edema  Neuro: AAOx3, nonfocal  Psych: Appropriate   Data Reviewed: I have personally reviewed following labs and imaging studies  CBC: Recent Labs  Lab 12/27/18 0538 12/28/18 0701  WBC 3.2* 3.4*  NEUTROABS 0.8*  --   HGB 12.4* 12.5*  HCT 38.1* 38.7*  MCV 84.9 84.3  PLT 176 329   Basic Metabolic Panel: Recent Labs  Lab 12/27/18 0538 12/28/18 0701 12/31/18 0402 01/01/19 0412 01/02/19 0443  NA 136 139 140 140 138  K 3.8 3.9 3.8 4.1 3.2*  CL 101 108 107 111 107  CO2 _1 GLUCOSE 85 91 90 85 111*  BUN _2 CREATININE 0.42* 0.46* 0.43* 0.47* 0.40*  CALCIUM 9.4 8.9 9.2 8.8* 9.0  MG 1.9  --   --   --   --   PHOS 4.5  --   --   --   --    GFR: Estimated Creatinine Clearance: 129.6 mL/min (A) (by C-G formula based on SCr of 0.4 mg/dL (L)). Liver Function Tests: Recent Labs  Lab 12/27/18 0538 12/28/18 0701 01/01/19 0412 01/02/19 0443  AST 199* 148* 116* 162*  ALT 187* 156* 112* 123*  ALKPHOS 39 36* 35* 36*  BILITOT 0.6 0.6 0.6 0.4  PROT 7.5 7.2 7.2 6.6  ALBUMIN 3.5 3.1* 3.0* 3.0*   No results for input(s): LIPASE, AMYLASE in the last 168 hours. No results for input(s): AMMONIA in the last 168 hours. Coagulation Profile: No results for input(s): INR, PROTIME in the last 168 hours. Cardiac Enzymes: Recent Labs  Lab 12/28/18 1808 12/30/18 0930 12/31/18 0402  01/01/19 0412 01/02/19 0443  CKTOTAL 9,312* 8,645* 8,941* 8,143* 11,157*   BNP (last 3 results) No results for input(s): PROBNP in the last 8760 hours. HbA1C: No results for input(s): HGBA1C in the last 72 hours. CBG: Recent Labs  Lab 12/27/18 0518  GLUCAP 67   Lipid Profile: Recent Labs    01/01/19 1131  CHOL 160  HDL 32*  LDLCALC 116*  TRIG 61  CHOLHDL 5.0   Thyroid Function Tests: No results for input(s): TSH, T4TOTAL, FREET4, T3FREE, THYROIDAB in the last 72 hours. Anemia Panel: No results for input(s): VITAMINB12, FOLATE, FERRITIN, TIBC, IRON, RETICCTPCT in the last 72 hours. Urine analysis:    Component Value Date/Time   COLORURINE YELLOW 12/22/2018 1111   APPEARANCEUR CLEAR 12/22/2018 1111   LABSPEC 1.021 12/22/2018 1111   PHURINE 5.0 12/22/2018 1111   GLUCOSEU NEGATIVE 12/22/2018 1111   HGBUR MODERATE (A) 12/22/2018 [place] 12/22/2018 [place] 12/22/2018 1111  PROTEINUR NEGATIVE 12/22/2018 1111   NITRITE NEGATIVE 12/22/2018 1111   [place] 12/22/2018 1111   Sepsis Labs: _0 (procalcitonin:4,lacticidven:4)  ) Recent Results (from the past 240 hour(s))  SARS Coronavirus 2 (CEPHEID - Performed in [facility] hospital lab), Hosp Order     Status: None   Collection Time: 12/27/18  8:03 AM  Result Value Ref Range Status   SARS Coronavirus 2 NEGATIVE NEGATIVE Final    Comment: (NOTE) If result is NEGATIVE SARS-CoV-2 target nucleic acids are NOT DETECTED. The SARS-CoV-2 RNA is generally detectable in upper and lower  respiratory specimens during the acute phase of infection. The lowest  concentration of SARS-CoV-2 viral copies this assay can detect is 250  copies / mL. A negative result does not preclude SARS-CoV-2 infection  and should not be used as the sole basis for treatment or other  patient management decisions.  A negative result may occur with  improper specimen collection / handling,  submission of specimen other  than nasopharyngeal swab, presence of viral mutation(s) within the  areas targeted by this assay, and inadequate number of viral copies  (<250 copies / mL). A negative result must be combined with clinical  observations, patient history, and epidemiological information. If result is POSITIVE SARS-CoV-2 target nucleic acids are DETECTED. The SARS-CoV-2 RNA is generally detectable in upper and lower  respiratory specimens dur ing the acute phase of infection.  Positive  results are indicative of active infection with SARS-CoV-2.  Clinical  correlation with patient history and other diagnostic information is  necessary to determine patient infection status.  Positive results do  not rule out bacterial infection or co-infection with other viruses. If result is PRESUMPTIVE POSTIVE SARS-CoV-2 nucleic acids MAY BE PRESENT.   A presumptive positive result was obtained on the submitted specimen  and confirmed on repeat testing.  While 2019 novel coronavirus  (SARS-CoV-2) nucleic acids may be present in the submitted sample  additional confirmatory testing may be necessary for epidemiological  and / or clinical management purposes  to differentiate between  SARS-CoV-2 and other Sarbecovirus currently known to infect humans.  If clinically indicated additional testing with an alternate test  methodology [phone]) is advised. The SARS-CoV-2 RNA is generally  detectable in upper and lower respiratory sp ecimens during the acute  phase of infection. The expected result is Negative. Fact Sheet for Patients:  StrictlyIdeas.no Fact Sheet for Healthcare Providers: BankingDealers.co.za This test is not yet approved or cleared by the [place] FDA and has been authorized for detection and/or diagnosis of SARS-CoV-2 by FDA under an Emergency Use Authorization (EUA).  This EUA will remain in effect (meaning this test can be  used) for the duration of the COVID-19 declaration under Section 564(b)(1) of the Act, 21 U.S.C. section 360bbb-3(b)(1), unless the authorization is terminated or revoked sooner. Performed at [facility], [place] [address] [place], [place] [address]   CSF culture     Status: None   Collection Time: 12/27/18  1:45 PM  Result Value Ref Range Status   Specimen Description CSF  Final   Special Requests NONE  Final   Gram Stain NO WBC SEEN NO ORGANISMS SEEN CYTOSPIN SMEAR   Final   Culture   Final    NO GROWTH 3 DAYS Performed at [facility], [place] [address] [place], [place] [address]    Report Status 12/30/2018 FINAL  Final      Radiology Studies: No results found.   Scheduled Meds: .  docusate sodium  100 mg Oral BID  . folic acid  1 mg Oral Daily  . levETIRAcetam  1,000 mg Oral BID  . multivitamin with minerals  1 tablet Oral Daily  . potassium chloride  40 mEq Oral Once  . thiamine  100 mg Oral Daily   Continuous Infusions: . sodium chloride 250 mL/hr at 01/02/19 1204     LOS: 5 days   Time Spent in minutes   30 minutes  Roland Prine D.O. on 01/02/2019 at 12:40 PM  Between 7am to 7pm - Please see pager noted on [email or web address]  After 7pm go to [email or web address]  And look for the night coverage person covering for me after hours  [facility] Office  [phone]

## 2019-01-02 NOTE — Progress Notes (Signed)
[facility] for Infectious Disease  Date of Admission:  12/27/2018     ASSESSMENT: [name] ia a 23 year-old-male with a new diagnosis of HIV. He was admitted after experiencing focal seizure like activity and consequentially undergoing an MRI of the brain. This demonstrated restricted diffusion in the right parietal lobe concerning for encephalitis or low-grade tumor. LP performed with HSV, RPR negative. Protein, glucose, CSF and cell count unremarkable. HIV resulted positive. He is being evaluated for appropriate therapy pending additional lab results. RPR serum and LP negative. CD4 count 304, likely sufficient to avoid additional prophylaxis for opportunistic infections.   PLAN: 1. JC virus, GC/chlamydia, Epstein barr, CMV, HIV1 RNA PCR, HLA [id number] in process 2. TB evaluation with Quant GOLD plus ordered 3. 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 docusate sodium  100 mg Oral BID  . folic acid  1 mg Oral Daily  . levETIRAcetam  1,000 mg Oral BID  . multivitamin with minerals  1 tablet Oral Daily  . thiamine  100 mg Oral Daily   Continuous Infusions: . sodium chloride 250 mL/hr at 01/02/19 0758   PRN Meds:.acetaminophen **OR** acetaminophen, LORazepam, ondansetron **OR** ondansetron (ZOFRAN) IV, polyethylene glycol  SUBJECTIVE: The patient stated that he feels well today. He did inquire into the severity of his illness to which he was informed that overall he appears to be doing well and that more information would be available to determine what therapy was indicated when his labs returned. He was advised that he would need to follow-up with the [facility] clinic once discharged.   Review of  Systems: Review of Systems  Constitutional: Negative for chills, diaphoresis and fever.  Eyes: Negative for blurred vision, photophobia and redness.  Respiratory: Negative for cough and shortness of breath.   Cardiovascular: Negative for chest pain and leg swelling.  Gastrointestinal: Negative for nausea and vomiting.  Genitourinary: Negative for flank pain and urgency.  Musculoskeletal: Negative for myalgias.  Neurological: Negative for dizziness, tingling, tremors, focal weakness and headaches.  Psychiatric/Behavioral: Negative for depression. The patient is not nervous/anxious.    Allergies  Allergen Reactions  . Fosphenytoin Itching   OBJECTIVE: Vitals:   01/01/19 1225 01/01/19 1620 01/01/19 2350 01/02/19 0413  BP: (!) 116/92 122/87 (!) 136/97 (!) 137/98  Pulse: 74 61 (!) 57 73  Resp: _0 Temp: 98.3 F (36.8 C) 98.1 F (36.7 C) 98.2 F (36.8 C) (!) 97.3 F (36.3 C)  TempSrc: Oral Oral Oral Oral  SpO2: 99% 99% 100% 97%  Weight:      Height:       Body mass index is 23.08 kg/m.  Physical Exam Constitutional:      General: He is not in acute distress.    Appearance: He is well-developed. He is not diaphoretic.  HENT:     Head: Normocephalic and atraumatic.  Eyes:     Extraocular Movements: Extraocular movements intact.     Pupils: Pupils are equal, round, and reactive to light.  Neck:     Musculoskeletal: Normal range of motion.  Cardiovascular:     Rate and Rhythm: Normal rate and regular rhythm.     Heart sounds:  No murmur.  Pulmonary:     Effort: Pulmonary effort is normal. No respiratory distress.     Breath sounds: Normal breath sounds. No stridor.  Skin:    General: Skin is warm.  Neurological:     Mental Status: He is alert and oriented to person, place, and time.     Lab Results Lab Results  Component Value Date   WBC 3.4 (L) 12/28/2018   HGB 12.5 (L) 12/28/2018   HCT 38.7 (L) 12/28/2018   MCV 84.3 12/28/2018   PLT 160 12/28/2018     Lab Results  Component Value Date   CREATININE 0.40 (L) 01/02/2019   BUN 7 01/02/2019   NA 138 01/02/2019   K 3.2 (L) 01/02/2019   CL 107 01/02/2019   CO2 22 01/02/2019    Lab Results  Component Value Date   ALT 123 (H) 01/02/2019   AST 162 (H) 01/02/2019   ALKPHOS 36 (L) 01/02/2019   BILITOT 0.4 01/02/2019    Microbiology: Recent Results (from the past 240 hour(s))  SARS Coronavirus 2 (CEPHEID - Performed in [facility] hospital lab), Hosp Order     Status: None   Collection Time: 12/27/18  8:03 AM  Result Value Ref Range Status   SARS Coronavirus 2 NEGATIVE NEGATIVE Final    Comment: (NOTE) If result is NEGATIVE SARS-CoV-2 target nucleic acids are NOT DETECTED. The SARS-CoV-2 RNA is generally detectable in upper and lower  respiratory specimens during the acute phase of infection. The lowest  concentration of SARS-CoV-2 viral copies this assay can detect is 250  copies / mL. A negative result does not preclude SARS-CoV-2 infection  and should not be used as the sole basis for treatment or other  patient management decisions.  A negative result may occur with  improper specimen collection / handling, submission of specimen other  than nasopharyngeal swab, presence of viral mutation(s) within the  areas targeted by this assay, and inadequate number of viral copies  (<250 copies / mL). A negative result must be combined with clinical  observations, patient history, and epidemiological information. If result is POSITIVE SARS-CoV-2 target nucleic acids are DETECTED. The SARS-CoV-2 RNA is generally detectable in upper and lower  respiratory specimens dur ing the acute phase of infection.  Positive  results are indicative of active infection with SARS-CoV-2.  Clinical  correlation with patient history and other diagnostic information is  necessary to determine patient infection status.  Positive results do  not rule out bacterial infection or co-infection with other viruses.  If result is PRESUMPTIVE POSTIVE SARS-CoV-2 nucleic acids MAY BE PRESENT.   A presumptive positive result was obtained on the submitted specimen  and confirmed on repeat testing.  While 2019 novel coronavirus  (SARS-CoV-2) nucleic acids may be present in the submitted sample  additional confirmatory testing may be necessary for epidemiological  and / or clinical management purposes  to differentiate between  SARS-CoV-2 and other Sarbecovirus currently known to infect humans.  If clinically indicated additional testing with an alternate test  methodology [phone]) is advised. The SARS-CoV-2 RNA is generally  detectable in upper and lower respiratory sp ecimens during the acute  phase of infection. The expected result is Negative. Fact Sheet for Patients:  StrictlyIdeas.no Fact Sheet for Healthcare Providers: BankingDealers.co.za This test is not yet approved or cleared by the [place] FDA and has been authorized for detection and/or diagnosis of SARS-CoV-2 by FDA under an Emergency Use Authorization (EUA).  This EUA will remain in  effect (meaning this test can be used) for the duration of the COVID-19 declaration under Section 564(b)(1) of the Act, 21 U.S.C. section 360bbb-3(b)(1), unless the authorization is terminated or revoked sooner. Performed at [facility], [place] [address], [place], [place] [address]   CSF culture     Status: None   Collection Time: 12/27/18  1:45 PM  Result Value Ref Range Status   Specimen Description CSF  Final   Special Requests NONE  Final   Gram Stain NO WBC SEEN NO ORGANISMS SEEN CYTOSPIN SMEAR   Final   Culture   Final    NO GROWTH 3 DAYS Performed at [facility], [place] [address] [place], [place] [address]    Report Status 12/30/2018 FINAL  Final   Kathi Ludwig, MD [facility] [place] [facility] [phone] pager   [phone] cell  01/02/2019, 9:05 AM

## 2019-01-02 NOTE — Progress Notes (Signed)
Pt continues to refuse bed alarm and high fall risk bundle interventions. Educated about safety multiple times by multiple RNs.    Jeremy Lane

## 2019-01-02 NOTE — Progress Notes (Signed)
Pt refusing lab draws for QF Gold TB. ID MD made aware. Jeremy Lane

## 2019-01-03 DIAGNOSIS — R748 Abnormal levels of other serum enzymes: Secondary | ICD-10-CM

## 2019-01-03 LAB — COMPREHENSIVE METABOLIC PANEL
ALT: 134 U/L — ABNORMAL HIGH (ref 0–44)
AST: 183 U/L — ABNORMAL HIGH (ref 15–41)
Albumin: 3.1 g/dL — ABNORMAL LOW (ref 3.5–5.0)
Alkaline Phosphatase: 40 U/L (ref 38–126)
Anion gap: 6 (ref 5–15)
BUN: 5 mg/dL — ABNORMAL LOW (ref 6–20)
CO2: 22 mmol/L (ref 22–32)
Calcium: 8.8 mg/dL — ABNORMAL LOW (ref 8.9–10.3)
Chloride: 112 mmol/L — ABNORMAL HIGH (ref 98–111)
Creatinine, Ser: 0.44 mg/dL — ABNORMAL LOW (ref 0.61–1.24)
GFR calc Af Amer: >60 mL/min (ref >60)
GFR calc non Af Amer: >60 mL/min (ref >60)
Glucose, Bld: 90 mg/dL (ref 70–99)
Potassium: 3.8 mmol/L (ref 3.5–5.1)
Sodium: 140 mmol/L (ref 135–145)
Total Bilirubin: 0.3 mg/dL (ref 0.3–1.2)
Total Protein: 7 g/dL (ref 6.5–8.1)

## 2019-01-03 LAB — GLUCOSE, CAPILLARY: Glucose-Capillary: 81 mg/dL (ref 70–99)

## 2019-01-03 LAB — CK
Total CK: 14320 U/L — ABNORMAL HIGH (ref 49–397)
Total CK: 11185 U/L — ABNORMAL HIGH (ref 49–397)

## 2019-01-03 MED ORDER — THIAMINE HCL 100 MG PO TABS: 100.0000 mg | ORAL_TABLET | Freq: Every day | ORAL | Status: DC

## 2019-01-03 MED ORDER — LEVETIRACETAM 1000 MG PO TABS: 1000.0000 mg | ORAL_TABLET | Freq: Two times a day (BID) | ORAL | 0 refills | Status: DC

## 2019-01-03 MED ORDER — FOLIC ACID 1 MG PO TABS: 1.0000 mg | ORAL_TABLET | Freq: Every day | ORAL | Status: DC

## 2019-01-03 MED ORDER — ADULT MULTIVITAMIN W/MINERALS CH: 1.0000 | ORAL_TABLET | Freq: Every day | ORAL | Status: DC

## 2019-01-03 NOTE — Progress Notes (Signed)
Pt left unit against medical advise, A/O x4 and competent to make decision for self. AMA information explained thoroughly to patient and pt Aunt via phone call. Pt and Aunt were verbally aggressive towards nurse, yelling and cursing at the nurses station. Pt educated on safety and the reason he needed to stay for further evaluation. Pt ultimately signed AMA paperwork and left unit. All lines removed.   Jeremy Lane

## 2019-01-03 NOTE — Progress Notes (Addendum)
PROGRESS NOTE    Jeremy Lane  JQZ:[id number] DOB: 23-May-1996 DOA: 12/27/2018 PCP: Patient, No Pcp Per   Brief Narrative:  HPI On 12/27/2018 by Dr. Karmen Bongo Lenin Kuhnle is a 23 y.o. male with medical history significant of HTN presenting with unilateral leg twitching.  The patient was quite somnolent at the time of my evaluation and was able to answer only a few questions.  He denied fever.  HPI per Dr. Lorraine Lax:  Jeremy Lane is a 23 y.o. male with past medical history of hypertension, heavy alcohol abuse, marijuana use presents to the ED with continuous seizure-like activity since 5 days.  Per description from the EDP, left leg had continuous rhythmic activity from hip downwards. The patient states that this is been going on for about 5 days.  Patient was seen in the emergency department on 5/18 after presenting with possible seizure-like activity. He has some left pain and tingling followed by jerking. This happened just prior to him having to go to court. Patient was discharged from the ED with plan to follow-up with neurologist.  Patient states that since his discharge he has been having continuous jerking of the left leg that has persisted throughout the day. Patient was given 1 mg of Ativan and received a load of Dilantin.  On assessment, patient had received Dilantin and his leg jerking had stopped. Patient also started itching after receiving Dilantin.  Interim history Admitted for seizure and acute rhabdo. Increased IVF. Assessment & Plan   Seizure -Possibly alcohol induced versus HIV encephalitis versus primary epileptic disorder -States twitching is improved but continues to have leg cramps on occasion -Started on Keppra 1 g twice daily, transition to oral -MRI brain: Cortical edema right parietal lobe without restricted diffusion or enhancement.  Related to recent seizure activity, repeat MRI in 2 to 3 months. -Status post lumbar puncture, culture shows no growth,  Gram stain no organisms seen -Neurology consulted and appreciated recommends MRI in 4 weeks and outpatient neurology follow-up -Continue seizure precautions -HIV 1Ab positive -Continues to need Ativan PRN for leg twitches  -will discuss again with neurology  Acute rhabdomyolysis -Secondary to seizure -CK level on admission 9312 -CK level worsened today, 14,320- however patient continues to have leg twitches (?focal seizure) -Continue 250 cc/h, and continue to [facility]  Essential hypertension -BP Stable  Elevated LFTs -Possibly secondary to the above versus alcoholism -Abd [place]- normal RUQ [place] -Continue IV fluids and monitor CMP  Alcohol abuse -Discussed Cessation -Continue seizure precautions, alcohol withdrawal precautions with CIWA protocol, vitamin, folic acid, thiamine  Suicidal ideations -Psychiatry consulted and appreciated, no further complaints of suicidal homicidal thoughts  HIV positive -HIV-1 Ab positive -discussed with patient -Infectious disease consulted and appreciated- would like to check CD4, HIV RNA and genotype, HLA, RPR, gc/chlamydia, Add CSF (CMV, EBV, JC virus) -RPR nonreactive, CD4 304 -Refused quantiferon gold   DVT Prophylaxis  SCDs  Code Status: Full  Family Communication: None at bedside  Disposition Plan: Admitted.  Pending improvement and CK levels.  Disposition to home  Consultants Neurology  Infectious disease  Procedures  Abdominal ultrasound Lumbar puncture  Antibiotics   Anti-infectives (From admission, onward)   None      Subjective:   Jeremy Lane seen and examined today.  Has no complaints today. Wants to sleep. Denies chest pain, shortness of breath, abdominal pain ,N/V/D/C.  Objective:   Vitals:   01/02/19 1637 01/02/19 2021 01/02/19 2315 01/03/19 0414  BP: 109/63 (!) 136/104 (!) 131/95 (!) 131/104  Pulse: (!) 50 60 (!) 53 (!) 50  Resp: '17 18 18   ' Temp: 98.6 F (37 C) 98.4 F (36.9 C) 98.4 F (36.9 C) 97.6 F  (36.4 C)  TempSrc: Oral Oral Oral Oral  SpO2: 100% 100% 100% 92%  Weight:      Height:        Intake/Output Summary (Last 24 hours) at 01/03/2019 1012 Last data filed at 01/03/2019 0330 Gross per 24 hour  Intake 3240 ml  Output 3500 ml  Net -260 ml   Filed Weights   12/27/18 0228  Weight: 64.9 kg   Exam  General: Well developed, well nourished, NAD, appears stated age  HEENT: NCAT, mucous membranes moist.   Extremities: warm dry without cyanosis clubbing or edema  Neuro: AAOx3, nonfocal  Psych: Normal affect and demeanor  Data Reviewed: I have personally reviewed following labs and imaging studies  CBC: Recent Labs  Lab 12/28/18 0701  WBC 3.4*  HGB 12.5*  HCT 38.7*  MCV 84.3  PLT 751   Basic Metabolic Panel: Recent Labs  Lab 12/28/18 0701 12/31/18 0402 01/01/19 0412 01/02/19 0443 01/03/19 0412  NA 139 140 140 138 140  K 3.9 3.8 4.1 3.2* 3.8  CL 108 107 111 107 112*  CO2 '24 24 24 22 22  ' GLUCOSE 91 90 85 111* 90  BUN '9 8 7 7 ' 5*  CREATININE 0.46* 0.43* 0.47* 0.40* 0.44*  CALCIUM 8.9 9.2 8.8* 9.0 8.8*   GFR: Estimated Creatinine Clearance: 129.6 mL/min (A) (by C-G formula based on SCr of 0.44 mg/dL (L)). Liver Function Tests: Recent Labs  Lab 12/28/18 0701 01/01/19 0412 01/02/19 0443 01/03/19 0412  AST 148* 116* 162* 183*  ALT 156* 112* 123* 134*  ALKPHOS 36* 35* 36* 40  BILITOT 0.6 0.6 0.4 0.3  PROT 7.2 7.2 6.6 7.0  ALBUMIN 3.1* 3.0* 3.0* 3.1*   No results for input(s): LIPASE, AMYLASE in the last 168 hours. No results for input(s): AMMONIA in the last 168 hours. Coagulation Profile: No results for input(s): INR, PROTIME in the last 168 hours. Cardiac Enzymes: Recent Labs  Lab 12/30/18 0930 12/31/18 0402 01/01/19 0412 01/02/19 0443 01/03/19 0412  CKTOTAL 8,645* 8,941* 8,143* 11,157* 14,320*   BNP (last 3 results) No results for input(s): PROBNP in the last 8760 hours. HbA1C: No results for input(s): HGBA1C in the last 72 hours.  CBG: Recent Labs  Lab 01/03/19 0800  GLUCAP 81   Lipid Profile: Recent Labs    01/01/19 1131  CHOL 160  HDL 32*  LDLCALC 116*  TRIG 61  CHOLHDL 5.0   Thyroid Function Tests: No results for input(s): TSH, T4TOTAL, FREET4, T3FREE, THYROIDAB in the last 72 hours. Anemia Panel: No results for input(s): VITAMINB12, FOLATE, FERRITIN, TIBC, IRON, RETICCTPCT in the last 72 hours. Urine analysis:    Component Value Date/Time   COLORURINE YELLOW 12/22/2018 1111   APPEARANCEUR CLEAR 12/22/2018 1111   LABSPEC 1.021 12/22/2018 1111   PHURINE 5.0 12/22/2018 1111   GLUCOSEU NEGATIVE 12/22/2018 1111   HGBUR MODERATE (A) 12/22/2018 1111   BILIRUBINUR NEGATIVE 12/22/2018 1111   KETONESUR NEGATIVE 12/22/2018 1111   PROTEINUR NEGATIVE 12/22/2018 1111   NITRITE NEGATIVE 12/22/2018 1111   LEUKOCYTESUR NEGATIVE 12/22/2018 1111   Sepsis Labs: '@LABRCNTIP' (procalcitonin:4,lacticidven:4)  ) Recent Results (from the past 240 hour(s))  SARS Coronavirus 2 (CEPHEID - Performed in [facility] lab), Hosp Order     Status: None   Collection Time: 12/27/18  8:03 AM  Result Value  Ref Range Status   SARS Coronavirus 2 NEGATIVE NEGATIVE Final    Comment: (NOTE) If result is NEGATIVE SARS-CoV-2 target nucleic acids are NOT DETECTED. The SARS-CoV-2 RNA is generally detectable in upper and lower  respiratory specimens during the acute phase of infection. The lowest  concentration of SARS-CoV-2 viral copies this assay can detect is 250  copies / mL. A negative result does not preclude SARS-CoV-2 infection  and should not be used as the sole basis for treatment or other  patient management decisions.  A negative result may occur with  improper specimen collection / handling, submission of specimen other  than nasopharyngeal swab, presence of viral mutation(s) within the  areas targeted by this assay, and inadequate number of viral copies  (<250 copies / mL). A negative result must be combined  with clinical  observations, patient history, and epidemiological information. If result is POSITIVE SARS-CoV-2 target nucleic acids are DETECTED. The SARS-CoV-2 RNA is generally detectable in upper and lower  respiratory specimens dur ing the acute phase of infection.  Positive  results are indicative of active infection with SARS-CoV-2.  Clinical  correlation with patient history and other diagnostic information is  necessary to determine patient infection status.  Positive results do  not rule out bacterial infection or co-infection with other viruses. If result is PRESUMPTIVE POSTIVE SARS-CoV-2 nucleic acids MAY BE PRESENT.   A presumptive positive result was obtained on the submitted specimen  and confirmed on repeat testing.  While 2019 novel coronavirus  (SARS-CoV-2) nucleic acids may be present in the submitted sample  additional confirmatory testing may be necessary for epidemiological  and / or clinical management purposes  to differentiate between  SARS-CoV-2 and other Sarbecovirus currently known to infect humans.  If clinically indicated additional testing with an alternate test  methodology [id number]) is advised. The SARS-CoV-2 RNA is generally  detectable in upper and lower respiratory sp ecimens during the acute  phase of infection. The expected result is Negative. Fact Sheet for Patients:  StrictlyIdeas.no Fact Sheet for Healthcare Providers: BankingDealers.co.za This test is not yet approved or cleared by the [place] FDA and has been authorized for detection and/or diagnosis of SARS-CoV-2 by FDA under an Emergency Use Authorization (EUA).  This EUA will remain in effect (meaning this test can be used) for the duration of the COVID-19 declaration under Section 564(b)(1) of the Act, 21 U.S.C. section 360bbb-3(b)(1), unless the authorization is terminated or revoked sooner. Performed at [facility], [place] [address], [place], [place] [address]   CSF culture     Status: None   Collection Time: 12/27/18  1:45 PM  Result Value Ref Range Status   Specimen Description CSF  Final   Special Requests NONE  Final   Gram Stain NO WBC SEEN NO ORGANISMS SEEN CYTOSPIN SMEAR   Final   Culture   Final    NO GROWTH 3 DAYS Performed at [facility], [place] [address] [place],  [address]    Report Status 12/30/2018 FINAL  Final      Radiology Studies: No results found.   Scheduled Meds: . docusate sodium  100 mg Oral BID  . folic acid  1 mg Oral Daily  . levETIRAcetam  1,000 mg Oral BID  . multivitamin with minerals  1 tablet Oral Daily  . thiamine  100 mg Oral Daily   Continuous Infusions: . sodium chloride 250 mL/hr at 01/03/19 1000     LOS: 6 days  Time Spent in minutes   30 minutes  Montine Hight D.O. on 01/03/2019 at 10:12 AM  Between 7am to 7pm - Please see pager noted on [email or web address]  After 7pm go to [email or web address]  And look for the night coverage person covering for me after hours  [facility] Office  [phone]

## 2019-01-03 NOTE — Progress Notes (Signed)
Pt refused all vital signs for 1200 and 1600.  Jeremy Lane

## 2019-01-03 NOTE — Discharge Instructions (Signed)
Seizure, Adult °When you have a seizure: °· Parts of your body may move. °· You may have a change in how aware or awake (conscious) you are. °· You may shake (convulse). °Seizures usually last from 30 seconds to 2 minutes. Usually, they are not harmful unless they last a long time. °What are the signs or symptoms? °Common symptoms of this condition include: °· Shaking (convulsions). °· Stiffness in the body. °· Passing out (losing consciousness). °· Uncontrolled movements in the: °? Arms or legs. °? Eyes. °? Head. °? Mouth. °Some people have symptoms right before a seizure happens. These symptoms may include: °· Fear. °· Worry (anxiety). °· Feeling like you are going to throw up (nausea). °· Feeling like the room is spinning (vertigo). °· Feeling like you saw or heard something before (déjà vu). °· Odd tastes or smells. °· Changes in vision, such as seeing flashing lights or spots. °Follow these instructions at home: °Medicines ° °· Take over-the-counter and prescription medicines only as told by your doctor. °· Do not eat or drink anything that may keep your medicine from working, such as alcohol. °Activity °· Do not do any activities that would be dangerous if you had another seizure, like driving or swimming. Wait until your doctor says it is safe for you to do them. °· If you live in the [place] ask your local DMV (department of motor vehicles) when you can drive. °· Get plenty of rest. °Teaching others ° °· Teach friends and family what to do when you have a seizure. They should: °? Lay you on the ground. °? Protect your head and body. °? Loosen any tight clothing around your neck. °? Turn you on your side. °? Not hold you down. °? Not put anything into your mouth. °? Know whether or not you need emergency care. °? Stay with you until you are better. °General instructions °· Contact your doctor each time you have a seizure. °· Avoid anything that gives you seizures. °· Keep a seizure diary. Write down: °? What  you think caused each seizure. °? What you remember about each seizure. °· Keep all follow-up visits as told by your doctor. This is important. °Contact a doctor if: °· You have another seizure. °· You have seizures more often. °· There is any change in what happens during your seizures. °· You keep having seizures with treatment. °· You have symptoms of being sick or having an infection. °Get help right away if: °· You have a seizure: °? That lasts longer than 5 minutes. °? That is different than seizures you had before. °? That makes it harder to breathe. °? After you hurt your head. °· After a seizure, you cannot speak or use a part of your body. °· After a seizure, you are confused or have a bad headache. °· You have two or more seizures in a row. °· You are having seizures more often. °· You do not wake up right after a seizure. °· You get hurt during a seizure. °In an emergency: °· These symptoms may be an emergency. Do not wait to see if the symptoms will go away. Get medical help right away. Call your local emergency services (911 in the [place] Do not drive yourself to the hospital. °Summary °· Seizures usually last from 30 seconds to 2 minutes. Usually, they are not harmful unless they last a long time. °· Do not eat or drink anything that may keep your medicine from working, such as alcohol. °·   Teach friends and family what to do when you have a seizure.  Contact your doctor each time you have a seizure. This information is not intended to replace advice given to you by your health care provider. Make sure you discuss any questions you have with your health care provider. Document Released: 01/09/2008 Document Revised: 04/16/2018 Document Reviewed: 08/29/2017 Elsevier Interactive Patient Education  2019 [employer].  Per [employer] statutes, patients with seizures are not allowed to drive until they have been seizure-free for six months. Use caution when using heavy equipment or power  tools. Avoid working on ladders or at heights. Take showers instead of baths. Ensure the water temperature is not too high on the home water heater. Do not go swimming alone. When caring for infants or small children, sit down when holding, feeding, or changing them to minimize risk of injury to the child in the event you have a seizure.

## 2019-01-03 NOTE — Care Management (Addendum)
Verified referral for [facility] through [facility]. Provided with MATCH letter. No other CM needs.

## 2019-01-03 NOTE — Discharge Summary (Signed)
Physician Discharge Summary  Jeremy Lane NVB:[id number] DOB: 02-Jun-1996 DOA: 12/27/2018  PCP: Patient, No Pcp Per  Admit date: 12/27/2018 Discharge date: 01/04/2019  Time spent: 45 minutes  Recommendations for Outpatient Follow-up:  Patient LEFT AMA  Discharge Diagnoses:  Seizure Acute rhabdomyolysis Essential hypertension Elevated LFTs Alcohol abuse Suicidal ideations HIV positive   Discharge Condition: Stable  Diet recommendation: regular  Filed Weights   12/27/18 0228  Weight: 64.9 kg    History of present illness:  On 12/27/2018 by Dr. Chucky May Lane a 23 y.o.malewith medical history significant ofHTN presenting with unilateral leg twitching.The patient was quite somnolent at the time of my evaluation and was able to answer only a few questions. He denied fever. HPI per Dr. Lorraine Lax:  Jeremy Lane a 23 y.o.malewith past medical history of hypertension, heavy alcohol abuse, marijuana use presents to the ED with continuous seizure-like activity since 5 days.  Per description from the EDP, left leg had continuous rhythmic activity from hip downwards. The patient states that this is been going on for about 5 days.  Patient was seen in the emergency department on 5/18 after presenting with possible seizure-like activity. He has some left pain and tingling followed by jerking. This happened just prior to him having to go to court. Patient was discharged from the ED with plan to follow-up with neurologist.  Patient states that since his discharge he has been having continuous jerking of the left leg that has persisted throughout the day. Patient was given 1 mg of Ativan and received a load of Dilantin.  On assessment, patient had received Dilantin and his leg jerking had stopped. Patient also started itching after receiving Dilantin.  Hospital Course:  Seizure -Possibly alcohol induced versus HIV encephalitis versus primary epileptic  disorder -States twitching is improved but continues to have leg cramps on occasion -Started on Keppra 1 g twice daily, transition to oral -MRI brain: Cortical edema right parietal lobe without restricted diffusion or enhancement.  Related to recent seizure activity, repeat MRI in 2 to 3 months. -Status post lumbar puncture, culture shows no growth, Gram stain no organisms seen -Neurology consulted and appreciated recommends MRI in 4 weeks and outpatient neurology follow-up -EEG:  There was no seizure or seizure predisposition recorded on this study -Continue seizure precautions -HIV 1Ab positive -wanted to reconsult neurology and explained this to the patient- however he left AMA -explained to him that if he is consistently needing Ativan for continued "leg twitches" - then this may be the reason for his continued rhabdo and the keppra may not be enough to control his symptoms  -Of note, keppra was already sent to the patient's pharmacy prior to him leaving AMA  Acute rhabdomyolysis -Secondary to seizure -CK level on admission 9312 -CK level worsened today, 14,320- however patient continues to have leg twitches (?focal seizure) -was placed on IVF  Essential hypertension -BP Stable  Elevated LFTs -Possibly secondary to the above versus alcoholism -Abd [place]- normal RUQ [place] -repeat LFTs in one week  Alcohol abuse -Discussed Cessation -Continue seizure precautions, alcohol withdrawal precautions with CIWA protocol, vitamin, folic acid, thiamine  Suicidal ideations -Psychiatry consulted and appreciated, no further complaints of suicidal homicidal thoughts  HIV positive -HIV-1 Ab positive -discussed with patient -Infectious disease consulted and appreciated- would like to check CD4, HIV RNA and genotype, HLA, RPR, gc/chlamydia, Add CSF (CMV, EBV, JC virus) -RPR nonreactive, CD4 304 -Refused quantiferon gold   Consultants Neurology  Infectious disease Psychiatry    Procedures  EEG Abdominal ultrasound Lumbar puncture  Discharge Exam: Vitals:   01/02/19 2315 01/03/19 0414  BP: (!) 131/95 (!) 131/104  Pulse: (!) 53 (!) 50  Resp: 18   Temp:  97.6 F (36.4 C)  SpO2: 100% 92%    NO EXAM as patient left AMA  Discharge Instructions  Allergies as of 01/03/2019      Reactions   Fosphenytoin Itching      Medication List    TAKE these medications   folic acid 1 MG tablet Commonly known as:  FOLVITE Take 1 tablet (1 mg total) by mouth daily.   levETIRAcetam 1000 MG tablet Commonly known as:  KEPPRA Take 1 tablet (1,000 mg total) by mouth 2 (two) times daily.   multivitamin with minerals Tabs tablet Take 1 tablet by mouth daily.   thiamine 100 MG tablet Take 1 tablet (100 mg total) by mouth daily.      Allergies  Allergen Reactions   Fosphenytoin Itching   Follow-up Information    [facility]. Call.   Why:  Support for your health diagnosis.  Contact information: [employer]. [place], [place] [address]  [phone]       Campbell Riches, MD. Go on 01/15/2019.   Specialty:  Infectious Diseases Why:  2pm Contact information: [address] [place] [address] [phone]            The results of significant diagnostics from this hospitalization (including imaging, microbiology, ancillary and laboratory) are listed below for reference.    Significant Diagnostic Studies: Dg Ribs Unilateral W/chest Left  Result Date: 12/22/2018 CLINICAL DATA:  Left rib pain after fall. EXAM: LEFT RIBS AND CHEST - 3+ VIEW COMPARISON:  None. FINDINGS: No fracture or other bone lesions are seen involving the ribs. There is no evidence of pneumothorax or pleural effusion. Both lungs are clear. Heart size and mediastinal contours are within normal limits. IMPRESSION: Negative. Electronically Signed   By: Marijo Conception M.D.   On: 12/22/2018 12:04   Ct Head Wo Contrast  Result Date: 12/22/2018 CLINICAL  DATA:  Syncopal episode with suspected seizure EXAM: CT HEAD WITHOUT CONTRAST TECHNIQUE: Contiguous axial images were obtained from the base of the skull through the vertex without intravenous contrast. COMPARISON:  None. FINDINGS: Brain: The ventricles are normal in size and configuration. There is no intracranial mass, hemorrhage, extra-axial fluid collection, or midline shift. The brain parenchyma appears unremarkable. No evident acute infarct. Vascular: No hyperdense vessel. There is no demonstrable vascular calcification. Skull: The bony calvarium appears intact. Sinuses/Orbits: There is opacification in several ethmoid air cells bilaterally. There is mucosal thickening in the anterior superior right maxillary antrum. Orbits appear symmetric bilaterally. Other: Mastoid air cells are clear. IMPRESSION: Areas of paranasal sinus disease.  Study otherwise unremarkable. Electronically Signed   By: Lowella Grip III M.D.   On: 12/22/2018 13:09   Mr Brain W And Wo Contrast  Result Date: 12/27/2018 CLINICAL DATA:  Seizure 12/22/2018.  Left leg pain. EXAM: MRI HEAD WITHOUT AND WITH CONTRAST TECHNIQUE: Multiplanar, multiecho pulse sequences of the brain and surrounding structures were obtained without and with intravenous contrast. CONTRAST:  6 mL Gadovist IV COMPARISON:  CT head 12/22/2018 FINDINGS: Brain: Cortical edema in the high right parietal lobe with relative sparing of the white matter. No associated restricted diffusion or enhancement. This area measures approximately 2.5 cm in diameter. No other areas of cortical edema. Normal white matter. Negative for acute infarct. No demyelinating disease or  hemorrhage. Normal enhancement postcontrast administration. Vascular: Normal arterial flow voids. Normal venous sinus enhancement. Skull and upper cervical spine: Negative Sinuses/Orbits: Prominent hypertrophy of the adenoid tissue. Mucosal edema paranasal sinuses. Negative orbit. Mastoid clear bilaterally  Other: None IMPRESSION: Cortical edema high right parietal lobe without restricted diffusion or enhancement. This is likely related to the patient's recent seizure activity. Differential diagnosis includes encephalitis and low-grade tumor. Follow-up MRI brain without with contrast recommended 2-3 months to evaluate for resolution. Electronically Signed   By: Franchot Gallo M.D.   On: 12/27/2018 07:32   US Abdomen Limited  Result Date: 12/29/2018 CLINICAL DATA:  Initial evaluation for elevated LFTs. EXAM: ULTRASOUND ABDOMEN LIMITED RIGHT UPPER QUADRANT COMPARISON:  None available. FINDINGS: Gallbladder: No gallstones or wall thickening visualized. No sonographic Murphy sign noted by sonographer. Common bile duct: Diameter: 2.4 mm Liver: No focal lesion identified. Within normal limits in parenchymal echogenicity. Portal vein is patent on color Doppler imaging with normal direction of blood flow towards the liver. IMPRESSION: Normal right upper quadrant ultrasound. Electronically Signed   By: Jeannine Boga M.D.   On: 12/29/2018 06:59   Dg Fluoro Guide Lumbar Puncture  Result Date: 12/27/2018 CLINICAL DATA:  Seizure.  Abnormal brain MRI. EXAM: LUMBAR PUNCTURE UNDER FLUOROSCOPY FLUOROSCOPY TIME:  0.1 minute; 15 uGym2 DAP TECHNIQUE: The procedure, risks (including but not limited to bleeding, infection, organ damage ), benefits, and alternatives were explained to the patient. Questions regarding the procedure were encouraged and answered. The patient understands and consents to the procedure. An appropriate skin entry site was determined fluoroscopically. Operator donned sterile gloves and mask. Skin site was marked, then prepped with Betadine, draped in usual sterile fashion, and infiltrated locally with 1% lidocaine. A 20 gauge spinal needle advanced into the thecal sac at L3-4 from a right interlaminar approach. Clear colorless CSF spontaneously returned, with opening pressure of 12 cm water. 106m CSF  were collected and divided among 4 sterile vials for the requested laboratory studies. The needle was then removed. COMPLICATIONS: None immediate IMPRESSION: 1. Technically successful lumbar puncture under fluoroscopy. Electronically Signed   By: DLucrezia EuropeM.D.   On: 12/27/2018 14:02    Microbiology: Recent Results (from the past 240 hour(s))  SARS Coronavirus 2 (CEPHEID - Performed in [facility]), Hosp Order     Status: None   Collection Time: 12/27/18  8:03 AM  Result Value Ref Range Status   SARS Coronavirus 2 NEGATIVE NEGATIVE Final    Comment: (NOTE) If result is NEGATIVE SARS-CoV-2 target nucleic acids are NOT DETECTED. The SARS-CoV-2 RNA is generally detectable in upper and lower  respiratory specimens during the acute phase of infection. The lowest  concentration of SARS-CoV-2 viral copies this assay can detect is 250  copies / mL. A negative result does not preclude SARS-CoV-2 infection  and should not be used as the sole basis for treatment or other  patient management decisions.  A negative result may occur with  improper specimen collection / handling, submission of specimen other  than nasopharyngeal swab, presence of viral mutation(s) within the  areas targeted by this assay, and inadequate number of viral copies  (<250 copies / mL). A negative result must be combined with clinical  observations, patient history, and epidemiological information. If result is POSITIVE SARS-CoV-2 target nucleic acids are DETECTED. The SARS-CoV-2 RNA is generally detectable in upper and lower  respiratory specimens dur ing the acute phase of infection.  Positive  results are indicative of active infection  with SARS-CoV-2.  Clinical  correlation with patient history and other diagnostic information is  necessary to determine patient infection status.  Positive results do  not rule out bacterial infection or co-infection with other viruses. If result is PRESUMPTIVE  POSTIVE SARS-CoV-2 nucleic acids MAY BE PRESENT.   A presumptive positive result was obtained on the submitted specimen  and confirmed on repeat testing.  While 2019 novel coronavirus  (SARS-CoV-2) nucleic acids may be present in the submitted sample  additional confirmatory testing may be necessary for epidemiological  and / or clinical management purposes  to differentiate between  SARS-CoV-2 and other Sarbecovirus currently known to infect humans.  If clinically indicated additional testing with an alternate test  methodology [phone]) is advised. The SARS-CoV-2 RNA is generally  detectable in upper and lower respiratory sp ecimens during the acute  phase of infection. The expected result is Negative. Fact Sheet for Patients:  StrictlyIdeas.no Fact Sheet for Healthcare Providers: BankingDealers.co.za This test is not yet approved or cleared by the [place] FDA and has been authorized for detection and/or diagnosis of SARS-CoV-2 by FDA under an Emergency Use Authorization (EUA).  This EUA will remain in effect (meaning this test can be used) for the duration of the COVID-19 declaration under Section 564(b)(1) of the Act, 21 U.S.C. section 360bbb-3(b)(1), unless the authorization is terminated or revoked sooner. Performed at [facility], [place] [address] [place], [place] [address]   CSF culture     Status: None   Collection Time: 12/27/18  1:45 PM  Result Value Ref Range Status   Specimen Description CSF  Final   Special Requests NONE  Final   Gram Stain NO WBC SEEN NO ORGANISMS SEEN CYTOSPIN SMEAR   Final   Culture   Final    NO GROWTH 3 DAYS Performed at [facility], [place] [address] [place],  [address]    Report Status 12/30/2018 FINAL  Final     Labs: Basic Metabolic Panel: Recent Labs  Lab 12/31/18 0402 01/01/19 0412 01/02/19 0443 01/03/19 0412  NA 140 140 138 140  K 3.8 4.1 3.2* 3.8  CL  107 111 107 112*  CO2 '24 24 22 22  ' GLUCOSE 90 85 111* 90  BUN '8 7 7 ' 5*  CREATININE 0.43* 0.47* 0.40* 0.44*  CALCIUM 9.2 8.8* 9.0 8.8*   Liver Function Tests: Recent Labs  Lab 01/01/19 0412 01/02/19 0443 01/03/19 0412  AST 116* 162* 183*  ALT 112* 123* 134*  ALKPHOS 35* 36* 40  BILITOT 0.6 0.4 0.3  PROT 7.2 6.6 7.0  ALBUMIN 3.0* 3.0* 3.1*   No results for input(s): LIPASE, AMYLASE in the last 168 hours. No results for input(s): AMMONIA in the last 168 hours. CBC: No results for input(s): WBC, NEUTROABS, HGB, HCT, MCV, PLT in the last 168 hours. Cardiac Enzymes: Recent Labs  Lab 12/31/18 0402 01/01/19 0412 01/02/19 0443 01/03/19 0412 01/03/19 1352  CKTOTAL 8,941* 8,143* 11,157* 14,320* 11,185*   BNP: BNP (last 3 results) No results for input(s): BNP in the last 8760 hours.  ProBNP (last 3 results) No results for input(s): PROBNP in the last 8760 hours.  CBG: Recent Labs  Lab 01/03/19 0800  GLUCAP 81       Signed:  Laelyn Blumenthal  [facility] 01/04/2019, 3:38 PM

## 2019-01-03 NOTE — Progress Notes (Signed)
PT Cancellation Note  Patient Details Name: Jeremy Lane MRN: [id number] DOB: 1995-10-06   Cancelled Treatment:      Therapy had difficulty waking the patient up at 12:30. Once awake the patient declined therapy. He stated " come back later". Therapy will return idf time allows.    Dessie Coma PT DPT  01/03/2019, 12:38 PM

## 2019-01-03 NOTE — Progress Notes (Addendum)
INFECTIOUS DISEASE PROGRESS NOTE  ID: Jeremy Lane is a 23 y.o. 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   Abtx:  Anti-infectives (From admission, onward)   None      Medications:  Scheduled: . docusate sodium  100 mg Oral BID  . folic acid  1 mg Oral Daily  . levETIRAcetam  1,000 mg Oral BID  . multivitamin with minerals  1 tablet Oral Daily  . thiamine  100 mg Oral Daily    Objective: Vital signs in last 24 hours: Temp:  [97.6 F (36.4 C)-98.6 F (37 C)] 97.6 F (36.4 C) (05/30 0414) Pulse Rate:  [50-78] 50 (05/30 0414) Resp:  [14-18] 18 (05/29 2315) BP: (109-136)/(63-104) 131/104 (05/30 0414) SpO2:  [92 %-100 %] 92 % (05/30 0414)   General appearance: alert, cooperative and no distress  Lab Results Recent Labs    01/02/19 0443 01/03/19 0412  NA 138 140  K 3.2* 3.8  CL 107 112*  CO2 22 22  BUN 7 5*  CREATININE 0.40* 0.44*   Liver Panel Recent Labs    01/02/19 0443 01/03/19 0412  PROT 6.6 7.0  ALBUMIN 3.0* 3.1*  AST 162* 183*  ALT 123* 134*  ALKPHOS 36* 40  BILITOT 0.4 0.3   Sedimentation Rate No results for input(s): ESRSEDRATE in the last 72 hours. C-Reactive Protein No results for input(s): CRP in the last 72 hours.  Microbiology: Recent Results (from the past 240 hour(s))  SARS Coronavirus 2 (CEPHEID - Performed in [facility] hospital lab), Hosp Order     Status: None   Collection Time: 12/27/18  8:03 AM  Result Value Ref Range Status   SARS Coronavirus 2 NEGATIVE NEGATIVE Final    Comment: (NOTE) If result is NEGATIVE SARS-CoV-2 target nucleic acids are NOT DETECTED. The SARS-CoV-2 RNA is generally  detectable in upper and lower  respiratory specimens during the acute phase of infection. The lowest  concentration of SARS-CoV-2 viral copies this assay can detect is 250  copies / mL. A negative result does not preclude SARS-CoV-2 infection  and should not be used as the sole basis for treatment or other  patient management decisions.  A negative result may occur with  improper specimen collection / handling, submission of specimen other  than nasopharyngeal swab, presence of viral mutation(s) within the  areas targeted by this assay, and inadequate number of viral copies  (<250 copies / mL). A negative result must be combined with clinical  observations, patient history, and epidemiological information. If result is POSITIVE SARS-CoV-2 target nucleic acids are DETECTED. The SARS-CoV-2 RNA is generally detectable in upper and lower  respiratory specimens dur ing the acute phase of infection.  Positive  results are indicative of active infection with SARS-CoV-2.  Clinical  correlation with patient history and other diagnostic information is  necessary to determine patient infection status.  Positive results do  not rule out bacterial infection or co-infection with other viruses. If result is PRESUMPTIVE POSTIVE SARS-CoV-2 nucleic acids MAY BE PRESENT.   A presumptive positive result was obtained on the submitted specimen  and confirmed on repeat testing.  While 2019 novel coronavirus  (  SARS-CoV-2) nucleic acids may be present in the submitted sample  additional confirmatory testing may be necessary for epidemiological  and / or clinical management purposes  to differentiate between  SARS-CoV-2 and other Sarbecovirus currently known to infect humans.  If clinically indicated additional testing with an alternate test  methodology [phone]([id number]) is advised. The SARS-CoV-2 RNA is generally  detectable in upper and lower respiratory sp ecimens during the acute  phase of infection. The  expected result is Negative. Fact Sheet for Patients:  BoilerBrush.com.cyhttps://www.fda.gov/media/136312/download Fact Sheet for Healthcare Providers: https://pope.com/https://www.fda.gov/media/136313/download This test is not yet approved or cleared by the [place] FDA and has been authorized for detection and/or diagnosis of SARS-CoV-2 by FDA under an Emergency Use Authorization (EUA).  This EUA will remain in effect (meaning this test can be used) for the duration of the COVID-19 declaration under Section 564(b)(1) of the Act, 21 U.S.C. section 360bbb-3(b)(1), unless the authorization is terminated or revoked sooner. Performed at [facility], [address] [place], [place] [address]   CSF culture     Status: None   Collection Time: 12/27/18  1:45 PM  Result Value Ref Range Status   Specimen Description CSF  Final   Special Requests NONE  Final   Gram Stain NO WBC SEEN NO ORGANISMS SEEN CYTOSPIN SMEAR   Final   Culture   Final    NO GROWTH 3 DAYS Performed at [facility], [address] [place], [place] [address]    Report Status 12/30/2018 FINAL  Final    Studies/Results: No results found.   Assessment/Plan: HIV+ Seizure Temporal lobe edema Hepatitis (A/B/C Ab-) Elevated CK  Total days of antibiotics: none  Await his ADAP approval to start HIV therapy Await his CSF viral studies (JC, CMV, EBV). These seem less likely given his CD4 Pt refused quantiferon gold. Will add to am lab.  Not sure what to attribute his CK to- he is asx and his liver u/s is normal.  Appreciate team caring for him.  F/u with me 6-11 at 2pm  CD4 T Cell Abs Marland Kitchen(/uL)  Date Value  01/01/2019 304 (L)            Johny SaxJeffrey Jamiracle Avants MD, FACP Infectious Diseases (pager) [phone] [email or web address] 01/03/2019, 10:19 AM  LOS: 6 days

## 2019-01-04 ENCOUNTER — Inpatient Hospital Stay (HOSPITAL_COMMUNITY)
Admission: EM | Admit: 2019-01-04 | Discharge: 2019-01-07 | DRG: 101 | Disposition: A | Discharge status: Home / Self Care | Payer: Self-pay | Attending: Internal Medicine | Admitting: Internal Medicine

## 2019-01-04 ENCOUNTER — Encounter (HOSPITAL_COMMUNITY): Payer: Self-pay

## 2019-01-04 ENCOUNTER — Other Ambulatory Visit: Payer: Self-pay

## 2019-01-04 DIAGNOSIS — F17210 Nicotine dependence, cigarettes, uncomplicated: Secondary | ICD-10-CM | POA: Diagnosis present

## 2019-01-04 DIAGNOSIS — G8314 Monoplegia of lower limb affecting left nondominant side: Secondary | ICD-10-CM | POA: Diagnosis present

## 2019-01-04 DIAGNOSIS — Z79899 Other long term (current) drug therapy: Secondary | ICD-10-CM

## 2019-01-04 DIAGNOSIS — T796XXA Traumatic ischemia of muscle, initial encounter: Secondary | ICD-10-CM | POA: Diagnosis present

## 2019-01-04 DIAGNOSIS — R253 Fasciculation: Secondary | ICD-10-CM | POA: Diagnosis present

## 2019-01-04 DIAGNOSIS — R569 Unspecified convulsions: Secondary | ICD-10-CM

## 2019-01-04 DIAGNOSIS — Z1159 Encounter for screening for other viral diseases: Secondary | ICD-10-CM

## 2019-01-04 DIAGNOSIS — I10 Essential (primary) hypertension: Secondary | ICD-10-CM | POA: Diagnosis present

## 2019-01-04 DIAGNOSIS — F1490 Cocaine use, unspecified, uncomplicated: Secondary | ICD-10-CM | POA: Diagnosis present

## 2019-01-04 DIAGNOSIS — G40201 Localization-related (focal) (partial) symptomatic epilepsy and epileptic syndromes with complex partial seizures, not intractable, with status epilepticus: Principal | ICD-10-CM | POA: Diagnosis present

## 2019-01-04 DIAGNOSIS — M6282 Rhabdomyolysis: Secondary | ICD-10-CM | POA: Diagnosis present

## 2019-01-04 DIAGNOSIS — Z21 Asymptomatic human immunodeficiency virus [HIV] infection status: Secondary | ICD-10-CM | POA: Diagnosis present

## 2019-01-04 DIAGNOSIS — J352 Hypertrophy of adenoids: Secondary | ICD-10-CM | POA: Diagnosis present

## 2019-01-04 DIAGNOSIS — F1210 Cannabis abuse, uncomplicated: Secondary | ICD-10-CM | POA: Diagnosis present

## 2019-01-04 DIAGNOSIS — F1010 Alcohol abuse, uncomplicated: Secondary | ICD-10-CM | POA: Diagnosis present

## 2019-01-04 DIAGNOSIS — F1910 Other psychoactive substance abuse, uncomplicated: Secondary | ICD-10-CM | POA: Diagnosis present

## 2019-01-04 DIAGNOSIS — B20 Human immunodeficiency virus [HIV] disease: Secondary | ICD-10-CM | POA: Diagnosis present

## 2019-01-04 HISTORY — DX: Unspecified convulsions: R56.9

## 2019-01-04 LAB — BASIC METABOLIC PANEL
Anion gap: 11 (ref 5–15)
BUN: 9 mg/dL (ref 6–20)
CO2: 24 mmol/L (ref 22–32)
Calcium: 9.5 mg/dL (ref 8.9–10.3)
Chloride: 104 mmol/L (ref 98–111)
Creatinine, Ser: 0.5 mg/dL — ABNORMAL LOW (ref 0.61–1.24)
GFR calc Af Amer: >60 mL/min (ref >60)
GFR calc non Af Amer: >60 mL/min (ref >60)
Glucose, Bld: 123 mg/dL — ABNORMAL HIGH (ref 70–99)
Potassium: 3.2 mmol/L — ABNORMAL LOW (ref 3.5–5.1)
Sodium: 139 mmol/L (ref 135–145)

## 2019-01-04 LAB — CBC
HCT: 37.8 % — ABNORMAL LOW (ref 39.0–52.0)
Hemoglobin: 12.4 g/dL — ABNORMAL LOW (ref 13.0–17.0)
MCH: 27.5 pg (ref 26.0–34.0)
MCHC: 32.8 g/dL (ref 30.0–36.0)
MCV: 83.8 fL (ref 80.0–100.0)
Platelets: 194 10*3/uL (ref 150–400)
RBC: 4.51 MIL/uL (ref 4.22–5.81)
RDW: 12.4 % (ref 11.5–15.5)
WBC: 3.7 10*3/uL — ABNORMAL LOW (ref 4.0–10.5)
nRBC: 0 % (ref 0.0–0.2)

## 2019-01-04 MED ORDER — IBUPROFEN 400 MG PO TABS
400.0000 mg | ORAL_TABLET | Freq: Once | ORAL | Status: DC | PRN
  Filled 2019-01-04: qty 1

## 2019-01-04 NOTE — ED Triage Notes (Signed)
Pt here for multiple seizures today, pt states he is taking keppra. Pt a.o at this time.

## 2019-01-05 ENCOUNTER — Encounter (HOSPITAL_COMMUNITY): Payer: Self-pay

## 2019-01-05 ENCOUNTER — Inpatient Hospital Stay (HOSPITAL_COMMUNITY): Payer: Self-pay

## 2019-01-05 DIAGNOSIS — R569 Unspecified convulsions: Secondary | ICD-10-CM

## 2019-01-05 DIAGNOSIS — M6282 Rhabdomyolysis: Secondary | ICD-10-CM | POA: Diagnosis present

## 2019-01-05 DIAGNOSIS — B20 Human immunodeficiency virus [HIV] disease: Secondary | ICD-10-CM

## 2019-01-05 DIAGNOSIS — I10 Essential (primary) hypertension: Secondary | ICD-10-CM

## 2019-01-05 DIAGNOSIS — F1910 Other psychoactive substance abuse, uncomplicated: Secondary | ICD-10-CM

## 2019-01-05 LAB — ETHANOL: Alcohol, Ethyl (B): <10 mg/dL (ref <10)

## 2019-01-05 LAB — BASIC METABOLIC PANEL
Anion gap: 7 (ref 5–15)
BUN: 6 mg/dL (ref 6–20)
CO2: 24 mmol/L (ref 22–32)
Calcium: 8.3 mg/dL — ABNORMAL LOW (ref 8.9–10.3)
Chloride: 109 mmol/L (ref 98–111)
Creatinine, Ser: 0.43 mg/dL — ABNORMAL LOW (ref 0.61–1.24)
GFR calc Af Amer: >60 mL/min (ref >60)
GFR calc non Af Amer: >60 mL/min (ref >60)
Glucose, Bld: 97 mg/dL (ref 70–99)
Potassium: 3.1 mmol/L — ABNORMAL LOW (ref 3.5–5.1)
Sodium: 140 mmol/L (ref 135–145)

## 2019-01-05 LAB — EPSTEIN BARR VRS(EBV DNA BY PCR)
EBV DNA QN by PCR: 189 copies/mL
log10 EBV DNA Qn PCR: 2.276 log10 copy/mL

## 2019-01-05 LAB — RAPID URINE DRUG SCREEN, HOSP PERFORMED
Amphetamines: NOT DETECTED
Barbiturates: NOT DETECTED
Benzodiazepines: NOT DETECTED
Cocaine: NOT DETECTED
Opiates: NOT DETECTED
Tetrahydrocannabinol: POSITIVE — AB

## 2019-01-05 LAB — CK
Total CK: 17670 U/L — ABNORMAL HIGH (ref 49–397)
Total CK: 16204 U/L — ABNORMAL HIGH (ref 49–397)

## 2019-01-05 LAB — SARS CORONAVIRUS 2 BY RT PCR (HOSPITAL ORDER, PERFORMED IN ~~LOC~~ HOSPITAL LAB): SARS Coronavirus 2: NEGATIVE

## 2019-01-05 LAB — LACTIC ACID, PLASMA: Lactic Acid, Venous: 0.8 mmol/L (ref 0.5–1.9)

## 2019-01-05 MED ORDER — ADULT MULTIVITAMIN W/MINERALS CH
1.0000 | ORAL_TABLET | Freq: Every day | ORAL | Status: DC
  Administered 2019-01-05 – 2019-01-07 (×3): 1 via ORAL
  Filled 2019-01-05 (×3): qty 1

## 2019-01-05 MED ORDER — DIVALPROEX SODIUM 250 MG PO DR TAB
500.0000 mg | DELAYED_RELEASE_TABLET | Freq: Two times a day (BID) | ORAL | Status: DC
  Administered 2019-01-05 – 2019-01-07 (×5): 500 mg via ORAL
  Filled 2019-01-05 (×5): qty 2

## 2019-01-05 MED ORDER — SODIUM CHLORIDE 0.9 % IV BOLUS (SEPSIS)
1000.0000 mL | Freq: Once | INTRAVENOUS | Status: AC
  Administered 2019-01-05: 1000 mL via INTRAVENOUS

## 2019-01-05 MED ORDER — LORAZEPAM 2 MG/ML IJ SOLN
1.0000 mg | Freq: Once | INTRAMUSCULAR | Status: AC
  Administered 2019-01-05: 1 mg via INTRAVENOUS
  Filled 2019-01-05: qty 1

## 2019-01-05 MED ORDER — ACETAMINOPHEN 325 MG PO TABS
650.0000 mg | ORAL_TABLET | Freq: Four times a day (QID) | ORAL | Status: DC | PRN
  Administered 2019-01-06 (×2): 650 mg via ORAL
  Filled 2019-01-05 (×2): qty 2

## 2019-01-05 MED ORDER — LEVETIRACETAM 500 MG PO TABS
1000.0000 mg | ORAL_TABLET | Freq: Two times a day (BID) | ORAL | Status: DC
  Administered 2019-01-05 – 2019-01-07 (×5): 1000 mg via ORAL
  Filled 2019-01-05 (×5): qty 2

## 2019-01-05 MED ORDER — POTASSIUM CHLORIDE CRYS ER 20 MEQ PO TBCR
40.0000 meq | EXTENDED_RELEASE_TABLET | Freq: Once | ORAL | Status: AC
  Administered 2019-01-05: 40 meq via ORAL
  Filled 2019-01-05: qty 2

## 2019-01-05 MED ORDER — VALPROATE SODIUM 500 MG/5ML IV SOLN
1500.0000 mg | Freq: Once | INTRAVENOUS | Status: AC
  Administered 2019-01-05: 1500 mg via INTRAVENOUS
  Filled 2019-01-05: qty 15

## 2019-01-05 MED ORDER — SODIUM CHLORIDE 0.9 % IV SOLN
INTRAVENOUS | Status: DC
  Administered 2019-01-05 – 2019-01-06 (×4): via INTRAVENOUS

## 2019-01-05 MED ORDER — LORAZEPAM 2 MG/ML IJ SOLN
1.0000 mg | INTRAMUSCULAR | Status: DC | PRN
  Administered 2019-01-06: 1 mg via INTRAVENOUS
  Filled 2019-01-05 (×2): qty 1

## 2019-01-05 MED ORDER — ONDANSETRON HCL 4 MG/2ML IJ SOLN: 4.0000 mg | Freq: Four times a day (QID) | INTRAMUSCULAR | Status: DC | PRN

## 2019-01-05 MED ORDER — LEVETIRACETAM IN NACL 1000 MG/100ML IV SOLN
1000.0000 mg | Freq: Once | INTRAVENOUS | Status: DC
  Filled 2019-01-05: qty 100

## 2019-01-05 MED ORDER — ONDANSETRON HCL 4 MG PO TABS: 4.0000 mg | ORAL_TABLET | Freq: Four times a day (QID) | ORAL | Status: DC | PRN

## 2019-01-05 MED ORDER — FOLIC ACID 1 MG PO TABS
1.0000 mg | ORAL_TABLET | Freq: Every day | ORAL | Status: DC
  Administered 2019-01-05 – 2019-01-07 (×3): 1 mg via ORAL
  Filled 2019-01-05 (×3): qty 1

## 2019-01-05 MED ORDER — VITAMIN B-1 100 MG PO TABS
100.0000 mg | ORAL_TABLET | Freq: Every day | ORAL | Status: DC
  Administered 2019-01-05 – 2019-01-07 (×3): 100 mg via ORAL
  Filled 2019-01-05 (×3): qty 1

## 2019-01-05 MED ORDER — ACETAMINOPHEN 650 MG RE SUPP: 650.0000 mg | Freq: Four times a day (QID) | RECTAL | Status: DC | PRN

## 2019-01-05 NOTE — ED Notes (Signed)
Patient in bed; yelling for help. VSS. With no obvious sign and symptoms of distress. Redirected patient to focus on breathing. Will continue to monitor.

## 2019-01-05 NOTE — Progress Notes (Signed)
PROGRESS NOTE    Jeremy Lane  WUJ:[id number] DOB: Feb 17, 1996 DOA: 01/04/2019 PCP: Patient, No Pcp Per   Brief Narrative:  HPI on 01/05/2019 by Dr. Lyda Perone Jeremy Lane is a 22 y.o. male with medical history significant of HTN, EtOH abuse, also uses cocaine and marijuana.  Patient was just admitted from 5/23-5/30 for partial status epilepticus, new diagnosis of HIV.  For a full review of that admission, see the DC summary from 5/30, but briefly: patient presented on 5/23 with 5 day h/o intractable twitching of LEFT leg, patient given fosphenytoin initially, seizures stopped but had itching.  Patient put on 1g keppra BID.  Patient had some ongoing intermittent twitching in his L leg later in the admission.  EEG was negative but MRI was positive for cortical edema likely related to his seizure state.  Additionally his CPK began to rise and trended up to 14k during the admission.  Patient left AMA on 5/30.  His partial seizures have persisted at home so he returns to ED.   Assessment & Plan   Admitted earlier today by Dr. Julian Reil. See H&P for details.  Status Epilepticus/ongoing seizures -Patient left AMA and returned due to further ongoing twitching in his left leg -Patient was given Ativan in the emergency apartment to stop the seizures -Continue Keppra 1000 mg twice daily -Neurology consulted and appreciated, Depakote has been added -Recent EEG unremarkable, MRI showed subcortical edema in the right parietal lobe-of note repeat MRI in 2 to 3 months  Rhabdomyolysis -Secondary to the above -CK level 16,204 -Continue IV fluids monitor CK levels  HIV disease -follow-up with infectious disease as an outpatient  Alcohol/polysubstance abuse -Continue thiamine, folic acid, multivitamin,CIWA protocol -UDS positive for THC  Essential hypertension -BP stable, currently on no medication  DVT Prophylaxis  SCDs  Code Status: Full  Family Communication: None at bedside   Disposition Plan: Admitted.   Consultants Neurology   Procedures  none  Antibiotics   Anti-infectives (From admission, onward)   None      Subjective:   Jeremy Lane seen and examined today.     Objective:   Vitals:   01/05/19 0145 01/05/19 0327 01/05/19 0805 01/05/19 1154  BP: (!) 137/97 129/87 (!) 129/92 (!) 133/98  Pulse:  (!) 46 60 (!) 57  Resp: Temp:  97.6 F (36.4 C) 97.8 F (36.6 C) 97.6 F (36.4 C)  TempSrc:  Oral Oral Oral  SpO2:  100%  100%    Intake/Output Summary (Last 24 hours) at 01/05/2019 1429 Last data filed at 01/05/2019 1404 Gross per 24 hour  Intake 2058.52 ml  Output 900 ml  Net 1158.52 ml   There were no vitals filed for this visit.  Exam Admitted earlier today.    Data Reviewed: I have personally reviewed following labs and imaging studies  CBC: Recent Labs  Lab 01/04/19 2211  WBC 3.7*  HGB 12.4*  HCT 37.8*  MCV 83.8  PLT 194   Basic Metabolic Panel: Recent Labs  Lab 01/01/19 0412 01/02/19 0443 01/03/19 0412 01/04/19 2211 01/05/19 0518  NA 140 138 140 139 140  K 4.1 3.2* 3.8 3.2* 3.1*  CL 111 107 112* 104 109  CO2 GLUCOSE 85 111* 90 123* 97  BUN 7 7 5* 9 6  CREATININE 0.47* 0.40* 0.44* 0.50* 0.43*  CALCIUM 8.8* 9.0 8.8* 9.5 8.3*   GFR: Estimated Creatinine Clearance: 129.6 mL/min (A) (by C-G formula based on  SCr of 0.43 mg/dL (L)). Liver Function Tests: Recent Labs  Lab 01/01/19 0412 01/02/19 0443 01/03/19 0412  AST 116* 162* 183*  ALT 112* 123* 134*  ALKPHOS 35* 36* 40  BILITOT 0.6 0.4 0.3  PROT 7.2 6.6 7.0  ALBUMIN 3.0* 3.0* 3.1*   No results for input(s): LIPASE, AMYLASE in the last 168 hours. No results for input(s): AMMONIA in the last 168 hours. Coagulation Profile: No results for input(s): INR, PROTIME in the last 168 hours. Cardiac Enzymes: Recent Labs  Lab 01/02/19 0443 01/03/19 0412 01/03/19 1352 01/04/19 2211 01/05/19 0518  CKTOTAL 11,157* 14,320*  11,185* 17,670* 16,204*   BNP (last 3 results) No results for input(s): PROBNP in the last 8760 hours. HbA1C: No results for input(s): HGBA1C in the last 72 hours. CBG: Recent Labs  Lab 01/03/19 0800  GLUCAP 81   Lipid Profile: No results for input(s): CHOL, HDL, LDLCALC, TRIG, CHOLHDL, LDLDIRECT in the last 72 hours. Thyroid Function Tests: No results for input(s): TSH, T4TOTAL, FREET4, T3FREE, THYROIDAB in the last 72 hours. Anemia Panel: No results for input(s): VITAMINB12, FOLATE, FERRITIN, TIBC, IRON, RETICCTPCT in the last 72 hours. Urine analysis:    Component Value Date/Time   COLORURINE YELLOW 12/22/2018 1111   APPEARANCEUR CLEAR 12/22/2018 1111   LABSPEC 1.021 12/22/2018 1111   PHURINE 5.0 12/22/2018 1111   GLUCOSEU NEGATIVE 12/22/2018 1111   HGBUR MODERATE (A) 12/22/2018 1111   BILIRUBINUR NEGATIVE 12/22/2018 1111   KETONESUR NEGATIVE 12/22/2018 1111   PROTEINUR NEGATIVE 12/22/2018 1111   NITRITE NEGATIVE 12/22/2018 1111   LEUKOCYTESUR NEGATIVE 12/22/2018 1111   Sepsis Labs: @LABRCNTIP (procalcitonin:4,lacticidven:4)  ) Recent Results (from the past 240 hour(s))  SARS Coronavirus 2 (CEPHEID - Performed in [facility] hospital lab), Hosp Order     Status: None   Collection Time: 12/27/18  8:03 AM  Result Value Ref Range Status   SARS Coronavirus 2 NEGATIVE NEGATIVE Final    Comment: (NOTE) If result is NEGATIVE SARS-CoV-2 target nucleic acids are NOT DETECTED. The SARS-CoV-2 RNA is generally detectable in upper and lower  respiratory specimens during the acute phase of infection. The lowest  concentration of SARS-CoV-2 viral copies this assay can detect is 250  copies / mL. A negative result does not preclude SARS-CoV-2 infection  and should not be used as the sole basis for treatment or other  patient management decisions.  A negative result may occur with  improper specimen collection / handling, submission of specimen other  than nasopharyngeal swab,  presence of viral mutation(s) within the  areas targeted by this assay, and inadequate number of viral copies  (<250 copies / mL). A negative result must be combined with clinical  observations, patient history, and epidemiological information. If result is POSITIVE SARS-CoV-2 target nucleic acids are DETECTED. The SARS-CoV-2 RNA is generally detectable in upper and lower  respiratory specimens dur ing the acute phase of infection.  Positive  results are indicative of active infection with SARS-CoV-2.  Clinical  correlation with patient history and other diagnostic information is  necessary to determine patient infection status.  Positive results do  not rule out bacterial infection or co-infection with other viruses. If result is PRESUMPTIVE POSTIVE SARS-CoV-2 nucleic acids MAY BE PRESENT.   A presumptive positive result was obtained on the submitted specimen  and confirmed on repeat testing.  While 2019 novel coronavirus  (SARS-CoV-2) nucleic acids may be present in the submitted sample  additional confirmatory testing may be necessary for epidemiological  and /  or clinical management purposes  to differentiate between  SARS-CoV-2 and other Sarbecovirus currently known to infect humans.  If clinically indicated additional testing with an alternate test  methodology [phone]) is advised. The SARS-CoV-2 RNA is generally  detectable in upper and lower respiratory sp ecimens during the acute  phase of infection. The expected result is Negative. Fact Sheet for Patients:  BoilerBrush.com.cy Fact Sheet for Healthcare Providers: https://pope.com/ This test is not yet approved or cleared by the [place] FDA and has been authorized for detection and/or diagnosis of SARS-CoV-2 by FDA under an Emergency Use Authorization (EUA).  This EUA will remain in effect (meaning this test can be used) for the duration of the COVID-19 declaration under  Section 564(b)(1) of the Act, 21 U.S.C. section 360bbb-3(b)(1), unless the authorization is terminated or revoked sooner. Performed at [facility], [address] [place], [place] [address]   CSF culture     Status: None   Collection Time: 12/27/18  1:45 PM  Result Value Ref Range Status   Specimen Description CSF  Final   Special Requests NONE  Final   Gram Stain NO WBC SEEN NO ORGANISMS SEEN CYTOSPIN SMEAR   Final   Culture   Final    NO GROWTH 3 DAYS Performed at [facility], [address], [place], [place] [address]    Report Status 12/30/2018 FINAL  Final  SARS Coronavirus 2 (CEPHEID - Performed in [facility] hospital lab), Hosp Order     Status: None   Collection Time: 01/05/19  2:13 AM  Result Value Ref Range Status   SARS Coronavirus 2 NEGATIVE NEGATIVE Final    Comment: (NOTE) If result is NEGATIVE SARS-CoV-2 target nucleic acids are NOT DETECTED. The SARS-CoV-2 RNA is generally detectable in upper and lower  respiratory specimens during the acute phase of infection. The lowest  concentration of SARS-CoV-2 viral copies this assay can detect is 250  copies / mL. A negative result does not preclude SARS-CoV-2 infection  and should not be used as the sole basis for treatment or other  patient management decisions.  A negative result may occur with  improper specimen collection / handling, submission of specimen other  than nasopharyngeal swab, presence of viral mutation(s) within the  areas targeted by this assay, and inadequate number of viral copies  (<250 copies / mL). A negative result must be combined with clinical  observations, patient history, and epidemiological information. If result is POSITIVE SARS-CoV-2 target nucleic acids are DETECTED. The SARS-CoV-2 RNA is generally detectable in upper and lower  respiratory specimens dur ing the acute phase of infection.  Positive  results are indicative of active infection with SARS-CoV-2.  Clinical   correlation with patient history and other diagnostic information is  necessary to determine patient infection status.  Positive results do  not rule out bacterial infection or co-infection with other viruses. If result is PRESUMPTIVE POSTIVE SARS-CoV-2 nucleic acids MAY BE PRESENT.   A presumptive positive result was obtained on the submitted specimen  and confirmed on repeat testing.  While 2019 novel coronavirus  (SARS-CoV-2) nucleic acids may be present in the submitted sample  additional confirmatory testing may be necessary for epidemiological  and / or clinical management purposes  to differentiate between  SARS-CoV-2 and other Sarbecovirus currently known to infect humans.  If clinically indicated additional testing with an alternate test  methodology [phone]) is advised. The SARS-CoV-2 RNA is generally  detectable in upper and lower respiratory  sp ecimens during the acute  phase of infection. The expected result is Negative. Fact Sheet for Patients:  BoilerBrush.com.cy Fact Sheet for Healthcare Providers: https://pope.com/ This test is not yet approved or cleared by the [place] FDA and has been authorized for detection and/or diagnosis of SARS-CoV-2 by FDA under an Emergency Use Authorization (EUA).  This EUA will remain in effect (meaning this test can be used) for the duration of the COVID-19 declaration under Section 564(b)(1) of the Act, 21 U.S.C. section 360bbb-3(b)(1), unless the authorization is terminated or revoked sooner. Performed at [facility], [address], [place], [place] [address]       Radiology Studies: No results found.   Scheduled Meds: . divalproex  500 mg Oral Q12H  . folic acid  1 mg Oral Daily  . levETIRAcetam  1,000 mg Oral BID  . multivitamin with minerals  1 tablet Oral Daily  . thiamine  100 mg Oral Daily   Continuous Infusions: . sodium chloride 150 mL/hr at 01/05/19  1245     LOS: 0 days   Time Spent in minutes   30 minutes  Rilea Arutyunyan D.O. on 01/05/2019 at 2:29 PM  Between 7am to 7pm - Please see pager noted on [email or web address]  After 7pm go to [email or web address]  And look for the night coverage person covering for me after hours  [facility] Office  [phone]

## 2019-01-05 NOTE — ED Notes (Signed)
ED TO INPATIENT HANDOFF REPORT  ED Nurse Name and Phone #: Wyline Copas [phone]  S Name/Age/Gender Jeremy Lane 23 y.o. male Room/Bed: 032C/032C  Code Status   Code Status: Full Code  Home/SNF/Other Home Patient oriented to: situation Is this baseline? No   Triage Complete: Triage complete  Chief Complaint Seizures  Triage Note Pt here for multiple seizures today, pt states he is taking keppra. Pt a.o at this time.    Allergies Allergies  Allergen Reactions  . Fosphenytoin Itching    Level of Care/Admitting Diagnosis ED Disposition    ED Disposition Condition Comment   Admit  Hospital Area: [facility] [[id number]]  Level of Care: Telemetry Medical [104]  Covid Evaluation: Confirmed COVID Negative  Diagnosis: Rhabdomyolysis [728.88.ICD-9-CM]  Admitting Physician: Hillary Bow [[id number]]  Attending Physician: Hillary Bow [[id number]]  Estimated length of stay: past midnight tomorrow  Certification:: I certify this patient will need inpatient services for at least 2 midnights  PT Class (Do Not Modify): Inpatient [101]  PT Acc Code (Do Not Modify): Private [1]       B Medical/Surgery History Past Medical History:  Diagnosis Date  . Hypertension   . Seizures (HCC)    History reviewed. No pertinent surgical history.   A IV Location/Drains/Wounds Patient Lines/Drains/Airways Status   Active Line/Drains/Airways    Name:   Placement date:   Placement time:   Site:   Days:   Peripheral IV 01/05/19 Right Forearm   01/05/19    0037    Forearm   less than 1          Intake/Output Last 24 hours No intake or output data in the 24 hours ending 01/05/19 0229  Labs/Imaging Results for orders placed or performed during the hospital encounter of 01/04/19 (from the past 48 hour(s))  Basic metabolic panel - if new onset seizures     Status: Abnormal   Collection Time: 01/04/19 10:11 PM  Result Value Ref Range   Sodium 139 135 - 145 mmol/L   Potassium 3.2 (L) 3.5 - 5.1 mmol/L   Chloride 104 98 - 111 mmol/L   CO2 24 22 - 32 mmol/L   Glucose, Bld 123 (H) 70 - 99 mg/dL   BUN 9 6 - 20 mg/dL   Creatinine, Ser 8.33 (L) 0.61 - 1.24 mg/dL   Calcium 9.5 8.9 - 38.3 mg/dL   GFR calc non Af Amer >60 >60 mL/min   GFR calc Af Amer >60 >60 mL/min   Anion gap 11 5 - 15    Comment: Performed at [facility], [address], [place], [place] [address]  CBC - if new onset seizures     Status: Abnormal   Collection Time: 01/04/19 10:11 PM  Result Value Ref Range   WBC 3.7 (L) 4.0 - 10.5 K/uL   RBC 4.51 4.22 - 5.81 MIL/uL   Hemoglobin 12.4 (L) 13.0 - 17.0 g/dL   HCT 66.0 (L) 60.0 - 45.9 %   MCV 83.8 80.0 - 100.0 fL   MCH 27.5 26.0 - 34.0 pg   MCHC 32.8 30.0 - 36.0 g/dL   RDW 97.7 41.4 - 23.9 %   Platelets 194 150 - 400 K/uL   nRBC 0.0 0.0 - 0.2 %    Comment: Performed at [facility], [address], [place], [place] [address]  CK     Status: Abnormal   Collection Time: 01/04/19 10:11 PM  Result Value Ref Range  Total CK 17,670 (H) 49 - 397 U/L    Comment: RESULTS CONFIRMED BY MANUAL DILUTION Performed at [facility], [address] [place], [place] [id number]    No results found.  Pending Labs Unresulted Labs (From admission, onward)    Start     Ordered   01/05/19 0500  Basic metabolic panel  Tomorrow morning,   R     01/05/19 0204   01/05/19 0500  CK  Daily,   R     01/05/19 0215   01/05/19 0217  Prolactin  Once,   R     01/05/19 0216   01/05/19 0217  Lactic acid, plasma  STAT Now then every 3 hours,   R     01/05/19 0216   01/05/19 0215  Ethanol  Once,   R     01/05/19 0214   01/05/19 0214  Urine rapid drug screen (hosp performed)  ONCE - STAT,   R     01/05/19 0214   01/05/19 0213  SARS Coronavirus 2 (CEPHEID - Performed in [facility]  Once,   R    Question:  Rule Out  Answer:  Yes   01/05/19 0212          Vitals/Pain Today's Vitals   01/05/19 0100 01/05/19 0115  01/05/19 0130 01/05/19 0145  BP: (!) 126/93 (!) 136/99 134/89 (!) 137/97  Pulse: (!) 52 (!) 50 (!) 50   Resp: 11 12 18 18   Temp:      TempSrc:      SpO2: 100% 99% 99%     Isolation Precautions No active isolations  Medications Medications  0.9 %  sodium chloride infusion (has no administration in time range)  potassium chloride SA (K-DUR) CR tablet 40 mEq (has no administration in time range)  levETIRAcetam (KEPPRA) tablet 1,000 mg (has no administration in time range)  multivitamin with minerals tablet 1 tablet (has no administration in time range)  thiamine tablet 100 mg (has no administration in time range)  folic acid (FOLVITE) tablet 1 mg (has no administration in time range)  acetaminophen (TYLENOL) tablet 650 mg (has no administration in time range)    Or  acetaminophen (TYLENOL) suppository 650 mg (has no administration in time range)  ondansetron (ZOFRAN) tablet 4 mg (has no administration in time range)    Or  ondansetron (ZOFRAN) injection 4 mg (has no administration in time range)  sodium chloride 0.9 % bolus 1,000 mL (0 mLs Intravenous Stopped 01/05/19 0108)  LORazepam (ATIVAN) injection 1 mg (1 mg Intravenous Given 01/05/19 0059)  sodium chloride 0.9 % bolus 1,000 mL (1,000 mLs Intravenous New Bag/Given 01/05/19 0140)    Mobility walks with person assist High fall risk   Focused Assessments Cardiac Assessment Handoff:  Cardiac Rhythm: Normal sinus rhythm Lab Results  Component Value Date   CKTOTAL 17,670 (H) 01/04/2019   No results found for: DDIMER Does the Patient currently have chest pain? No     R Recommendations: See Admitting Provider Note  Report given to:   Additional Notes: Please call if you have more questions, thanks.

## 2019-01-05 NOTE — Progress Notes (Signed)
Pt requested for something to calm him prior to MRI; NP notified and new order received. Medication given and pt transported off unit to MRI. Dionne Bucy RN

## 2019-01-05 NOTE — Progress Notes (Signed)
Patient no longer having jerking of his left leg this morning.  Does appear to be weaker compared to the right.  Explained to patient that I would like to repeat EEG and MRI brain. Patient hesitant to get EEG.    He did get MRI brain.  Reviewed his MRI brain and he demonstrates focal area of edema/with some restriction diffusion without ADC correlate as seen in previous MRI brain.  Radiology read pending.

## 2019-01-05 NOTE — H&P (Signed)
History and Physical    Jeremy DoppMarcus Tutor ZOX:[id number] DOB: [birth year] DOA: 01/04/2019  PCP: Patient, No Pcp Per  Patient coming from: Home  I have personally briefly reviewed [facility]  Chief Complaint: Seizures  HPI: Jeremy Lane is a 23 y.o. male with medical history significant of HTN, EtOH abuse, also uses cocaine and marijuana.  Patient was just admitted from 5/23-5/30 for partial status epilepticus, new diagnosis of HIV.  For a full review of that admission, see the DC summary from 5/30, but briefly: patient presented on 5/23 with 5 day h/o intractable twitching of LEFT leg, patient given fosphenytoin initially, seizures stopped but had itching.  Patient put on 1g keppra BID.  Patient had some ongoing intermittent twitching in his L leg later in the admission.  EEG was negative but MRI was positive for cortical edema likely related to his seizure state.  Additionally his CPK began to rise and trended up to 14k during the admission.  Patient left AMA on 5/30.  His partial seizures have persisted at home so he returns to ED.   ED Course: Given 1mg  ativan in ED with resolution (at least for the moment) of LLE seizure activity.  Patient awake, alert and oriented.   Review of Systems: As per HPI otherwise 10 point review of systems negative.   Past Medical History:  Diagnosis Date   Hypertension    Seizures (HCC)     History reviewed. No pertinent surgical history.   reports that he has been smoking cigarettes. He has been smoking about 0.20 packs per day. He has never used smokeless tobacco. He reports current alcohol use. He reports current drug use. Drugs: Marijuana and Cocaine.  Allergies  Allergen Reactions   Fosphenytoin Itching    No family history on file.   Prior to Admission medications   Medication Sig Start Date End Date Taking? Authorizing Provider  levETIRAcetam (KEPPRA) 1000 MG tablet Take 1 tablet (1,000 mg  total) by mouth 2 (two) times daily. 01/03/19  Yes Mikhail, MandersonMaryann, DO  folic acid (FOLVITE) 1 MG tablet Take 1 tablet (1 mg total) by mouth daily. 01/04/19   Edsel PetrinMikhail, Maryann, DO  Multiple Vitamin (MULTIVITAMIN WITH MINERALS) TABS tablet Take 1 tablet by mouth daily. 01/03/19   Edsel PetrinMikhail, Maryann, DO    Physical Exam: Vitals:   01/05/19 0100 01/05/19 0115 01/05/19 0130 01/05/19 0145  BP: (!) 126/93 (!) 136/99 134/89 (!) 137/97  Pulse: (!) 52 (!) 50 (!) 50   Resp: 11 12 18 18   Temp:      TempSrc:      SpO2: 100% 99% 99%     Constitutional: NAD, calm, comfortable Eyes: PERRL, lids and conjunctivae normal ENMT: Mucous membranes are moist. Posterior pharynx clear of any exudate or lesions.Normal dentition.  Neck: normal, supple, no masses, no thyromegaly Respiratory: clear to auscultation bilaterally, no wheezing, no crackles. Normal respiratory effort. No accessory muscle use.  Cardiovascular: Regular rate and rhythm, no murmurs / rubs / gallops. No extremity edema. 2+ pedal pulses. No carotid bruits.  Abdomen: no tenderness, no masses palpated. No hepatosplenomegaly. Bowel sounds positive.  Musculoskeletal: no clubbing / cyanosis. No joint deformity upper and lower extremities. Good ROM, no contractures. Normal muscle tone.  Skin: no rashes, lesions, ulcers. No induration Neurologic: CN 2-12 grossly intact. Sensation intact, DTR normal. Strength 5/5 in all 4.  Psychiatric: Normal judgment and insight. Alert and oriented x 3. Normal mood.    Labs on Admission: I  have personally reviewed following labs and imaging studies  CBC: Recent Labs  Lab 01/04/19 2211  WBC 3.7*  HGB 12.4*  HCT 37.8*  MCV 83.8  PLT 194   Basic Metabolic Panel: Recent Labs  Lab 12/31/18 0402 01/01/19 0412 01/02/19 0443 01/03/19 0412 01/04/19 2211  NA 140 140 138 140 139  K 3.8 4.1 3.2* 3.8 3.2*  CL 107 111 107 112* 104  CO2 GLUCOSE 90 85 111* 90 123*  BUN 5* 9  CREATININE  0.43* 0.47* 0.40* 0.44* 0.50*  CALCIUM 9.2 8.8* 9.0 8.8* 9.5   GFR: Estimated Creatinine Clearance: 129.6 mL/min (A) (by C-G formula based on SCr of 0.5 mg/dL (L)). Liver Function Tests: Recent Labs  Lab 01/01/19 0412 01/02/19 0443 01/03/19 0412  AST 116* 162* 183*  ALT 112* 123* 134*  ALKPHOS 35* 36* 40  BILITOT 0.6 0.4 0.3  PROT 7.2 6.6 7.0  ALBUMIN 3.0* 3.0* 3.1*   No results for input(s): LIPASE, AMYLASE in the last 168 hours. No results for input(s): AMMONIA in the last 168 hours. Coagulation Profile: No results for input(s): INR, PROTIME in the last 168 hours. Cardiac Enzymes: Recent Labs  Lab 01/01/19 0412 01/02/19 0443 01/03/19 0412 01/03/19 1352 01/04/19 2211  CKTOTAL 8,143* 11,157* 14,320* 11,185* 17,670*   BNP (last 3 results) No results for input(s): PROBNP in the last 8760 hours. HbA1C: No results for input(s): HGBA1C in the last 72 hours. CBG: Recent Labs  Lab 01/03/19 0800  GLUCAP 81   Lipid Profile: No results for input(s): CHOL, HDL, LDLCALC, TRIG, CHOLHDL, LDLDIRECT in the last 72 hours. Thyroid Function Tests: No results for input(s): TSH, T4TOTAL, FREET4, T3FREE, THYROIDAB in the last 72 hours. Anemia Panel: No results for input(s): VITAMINB12, FOLATE, FERRITIN, TIBC, IRON, RETICCTPCT in the last 72 hours. Urine analysis:    Component Value Date/Time   COLORURINE YELLOW 12/22/2018 1111   APPEARANCEUR CLEAR 12/22/2018 1111   LABSPEC 1.021 12/22/2018 1111   PHURINE 5.0 12/22/2018 1111   GLUCOSEU NEGATIVE 12/22/2018 1111   HGBUR MODERATE (A) 12/22/2018 1111   BILIRUBINUR NEGATIVE 12/22/2018 1111   KETONESUR NEGATIVE 12/22/2018 1111   PROTEINUR NEGATIVE 12/22/2018 1111   NITRITE NEGATIVE 12/22/2018 1111   LEUKOCYTESUR NEGATIVE 12/22/2018 1111    Radiological Exams on Admission: No results found.  EKG: Independently reviewed.  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 Status epilepticus due to ongoing simple partial seizures - 1. Continue Keppra 1gm BID for the moment 2. Ativan  in ED seems to have stopped seizure, will therefore put him on ativan  IV Q4H PRN for seizures for the moment 3. Neuro coming to consult on patient 4. procalcitonin and lactate ordered 5. EEG neg, but MRI demonstrating Cortical edema right parietal lobe without restricted diffusion or enhancement. Related to recent seizure activity, repeat MRI in 2 to 3 months. 6. HIV encephalitis is also a possibility 7. CMV, EBV, and JC virus also being tested for (I think tests still pending from CSF last admit), but ID thought these would be less likely given his CD4 count. 2. Rhabdomyolysis - 1. Suspect muscle breakdown secondary to seizures off and on for many days now 2. IVF: NS at 150 cc/hr 3. Intake and output 4. Daily BMP 5.  Daily CPK 3. HIV disease - 1. May wish to re-consult ID in AM as they were following during his stay 4. EtOH abuse - also polysubstance 1. Cont thiamine and folate 2. Ativan being given as above 3. Unlikely to withdraw as he was only out of hospital for 24h or so following a 7 day admission 4. Checking UDS 5. HTN - 1. Not on home meds, BP was stable last admit  DVT prophylaxis: SCDs Code Status: Full Family Communication: No family in room Disposition Plan: Home after admit Consults called: Dr. Amada Jupiter Admission status: Admit to inpatient  Severity of Illness: The appropriate patient status for this patient is INPATIENT. Inpatient status is judged to be reasonable and necessary in order to provide the required intensity of service to ensure the patient's safety. The patient's presenting symptoms, physical exam findings, and initial radiographic and laboratory data in the context of their chronic comorbidities is felt  to place them at high risk for further clinical deterioration. Furthermore, it is not anticipated that the patient will be medically stable for discharge from the hospital within 2 midnights of admission. The following factors support the patient status of inpatient.   IP status for treatment of ongoing partial status epilepticus.  Now causing rhabdomyolysis!   * I certify that at the point of admission it is my clinical judgment that the patient will require inpatient hospital care spanning beyond 2 midnights from the point of admission due to high intensity of service, high risk for further deterioration and high frequency of surveillance required.*    Rulon Abdalla M. DO [facility]  How to contact the [facility] Attending or Consulting provider 7A - 7P or covering provider during after hours 7P -7A, for this patient?  1. Check the care team in [facility] and look for a) attending/consulting [facility] provider listed and b) the [facility] team listed 2. Log into [email or web address]  Amion Physician Scheduling and messaging for groups and whole hospitals  On call and physician scheduling software for group practices, residents, hospitalists and other medical providers for call, clinic, rotation and shift schedules. [facility] is a hospital-wide system for scheduling doctors and paging doctors on call. EasyPlot is for scientific plotting and data analysis.  [email or web address]  and use [facility]'s universal password to access. If you do not have the password, please contact the hospital operator.  3. Locate the [facility] provider you are looking for under [facility] and page to a number that you can be directly reached. 4. If you still have difficulty reaching the provider, please page the [facility] (Director on Call) for the Hospitalists listed on amion for assistance.  01/05/2019, 2:34 AM

## 2019-01-05 NOTE — Progress Notes (Signed)
EEG Complete  Results Pending 

## 2019-01-05 NOTE — Procedures (Signed)
ELECTROENCEPHALOGRAM REPORT   Patient: Jeremy Lane       Room #: 0N02V EEG No. ID: [id number] Age: 23 y.o.        Sex: male Referring Physician: Catha Gosselin Report Date:  01/05/2019        Interpreting Physician: Thana Farr  History: [name] is an 23 y.o. male with a history of seizures presenting with leg twitching  Medications:  Keppra, Depakote, Folvite, Thiamine, MVI, Ativan  Conditions of Recording:  This is a 21 channel routine scalp EEG performed with bipolar and monopolar montages arranged in accordance to the international 10/20 system of electrode placement. One channel was dedicated to EKG recording.  The patient is in the awake, drowsy and asleep states.  Description:  The waking background activity is slow and of low voltage.  There is an overlying, fairly well organized, beta activity that is persistent throughout the recording and is diffusely distributed.  It obscures the background rhythm further during wakefulness.  The background does appear though to be slow and poorly organized.  This is maintained with what appears to be drowse clinically. When the patient goes into stage II sleep there are noted vertex central sharp transients and K-complexes.   No epileptiform activity is noted.    Hyperventilation was not performed.  (Hyperventilation produced a mild to moderate buildup but failed to elicit any abnormalities.)  Intermittent photic stimulation was performed but failed to illicit any change in the tracing.  (and elicits a symmetrical driving response but fails to elicit any abnormalities.)  IMPRESSION: This electroencephalogram is consistent with medication effects.  No epileptiform activity is noted.     Thana Farr, MD Neurology [phone] 01/05/2019, 5:16 PM

## 2019-01-05 NOTE — Consult Note (Signed)
Neurology Consultation Reason for Consult: Seizures Referring Physician: Laban Emperor  CC: Seizures  History is obtained from: Patient  HPI: Jeremy Lane is a 23 y.o. male recently admitted with new onset simple partial seizures that started in late May.  During that admission, he had an EEG which did not show any changes, but the MRI did show some cortical edema in the right parietal lobe.  He had an LP which did not show any clear reason for his new seizures.  At that time, his leg movements were fairly responsive to Ativan, but his CK was still trending up and he was needing Ativan frequently.  He left AMA despite this.  He has continued taking Keppra and reports compliance.  He states that he is having leg twitching at least every day.   ROS: A 14 point ROS was performed and is negative except as noted in the HPI.  Past Medical History:  Diagnosis Date  . Hypertension   . Seizures (HCC)      Family history: No history of similar   Social History:  reports that he has been smoking cigarettes. He has been smoking about 0.20 packs per day. He has never used smokeless tobacco. He reports current alcohol use. He reports current drug use. Drugs: Marijuana and Cocaine.   Exam: Current vital signs: BP 129/87 (BP Location: Right Arm)   Pulse (!) 46   Temp 97.6 F (36.4 C) (Oral)   Resp 18   SpO2 100%  Vital signs in last 24 hours: Temp:  [97.6 F (36.4 C)-98.6 F (37 C)] 97.6 F (36.4 C) (06/01 0327) Pulse Rate:  [46-72] 46 (06/01 0327) Resp:  [11-18] 18 (06/01 0327) BP: (107-139)/(87-102) 129/87 (06/01 0327) SpO2:  [99 %-100 %] 100 % (06/01 0327)   Physical Exam  Constitutional: Appears well-developed and well-nourished.  Psych: Affect appropriate to situation Eyes: No scleral injection HENT: No OP obstrucion Head: Normocephalic.  Cardiovascular: Normal rate and regular rhythm.  Respiratory: Effort normal, non-labored breathing GI: Soft.  No distension. There is no  tenderness.  Skin: WDI  Neuro: Mental Status: Patient is somnolent but easily arousable, oriented to person, place, month, year, and situation. Patient is able to give a clear and coherent history. No signs of aphasia or neglect Cranial Nerves: II: Visual Fields are full. Pupils are equal, round, and reactive to light.   III,IV, VI: EOMI without ptosis or diploplia.  V: Facial sensation is symmetric to temperature VII: Facial movement is symmetric.  VIII: hearing is intact to voice X: Uvula elevates symmetrically XI: Shoulder shrug is symmetric. XII: tongue is midline without atrophy or fasciculations.  Motor: Tone is normal. Bulk is normal. 5/5 strength was present in all four extremities.  Sensory: Sensation is symmetric to light touch and temperature in the arms and legs. Cerebellar: No clear ataxia  I have reviewed labs in epic and the results pertinent to this consultation are:  CK 17,670  I have reviewed the images obtained: MRI brain from last admission- small area of cortical edema the right parietal lobe  Impression: 23 year old male with new onset partial seizures.  Work-up is thus far been negative, I do not think we are likely to get complete control with Keppra given that he has not responded very well to it.  I would favor adding a second agent and given that he has had itching with Dilantin, I will choose Depakote instead.  Recommendations: 1) load with Depakote 1500 mg x 1 2) 500 mg twice  daily of Depakote following the load 3) continue Keppra 1 g twice daily.   Ritta SlotMcNeill Verlene Glantz, MD [facility] [phone]  If 7pm- 7am, please page neurology on call as listed in AMION.

## 2019-01-05 NOTE — ED Provider Notes (Signed)
[facility] EMERGENCY DEPARTMENT Provider Note   CSN: [id number] Arrival date & time: 01/04/19  2144    History   Chief Complaint Chief Complaint  Patient presents with  . Seizures   Level 5 caveat due to altered mental status HPI Jeremy Lane is a 23 y.o. male.     The history is provided by the patient. The history is limited by the condition of the patient.  Seizures  Seizure type:  Focal Postictal symptoms: somnolence   Return to baseline: no   Severity:  Moderate Progression:  Improving Patient with history of hypertension, seizures, HIV presents with seizure.  Patient reported multiple seizures today.  But at time of my evaluation patient was having an active seizure with leg twitching.  This resolved spontaneously and patient is now asleep.  Past Medical History:  Diagnosis Date  . Hypertension   . Seizures [facility])     Patient Active Problem List   Diagnosis Date Noted  . HIV disease (HCC) 01/02/2019  . 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 Seizure (HCC) 12/27/2018  . Essential hypertension 12/27/2018  . Elevated LFTs 12/27/2018  . Polysubstance abuse (HCC) 12/27/2018    History reviewed. No pertinent surgical history.      Home Medications    Prior to Admission medications   Medication Sig Start Date End Date Taking? Authorizing Provider  folic acid (FOLVITE) 1 MG tablet Take 1 tablet (1 mg total) by mouth daily. 01/04/19   Mikhail, Nita Sells, DO  levETIRAcetam (KEPPRA) 1000 MG tablet Take 1 tablet (1,000 mg total) by mouth 2 (two) times daily. 01/03/19   Edsel Petrin, DO  Multiple Vitamin (MULTIVITAMIN WITH MINERALS) TABS tablet Take 1 tablet by mouth daily. 01/03/19   Mikhail, Nita Sells, DO  thiamine 100 MG tablet Take 1 tablet (100 mg total) by mouth daily. 01/04/19   Edsel Petrin, DO     Family History No family history on file.  Social History Social History   Tobacco Use  . Smoking status: Current Every Day Smoker    Packs/day: 0.20    Types: Cigarettes  . Smokeless tobacco: Never Used  Substance Use Topics  . Alcohol use: Yes    Comment: states he has been drinking more lately because he is stressed  . Drug use: Yes    Types: Marijuana, Cocaine    Comment: cocaine "once in a blue moon"     Allergies   Fosphenytoin   Review of Systems Review of Systems  Unable to perform ROS: Mental status change  Neurological: Positive for seizures.     Physical Exam Updated Vital Signs BP (!) 139/102 (BP Location: Right Arm)   Pulse 72   Temp 98.6 F (37 C) (Oral)   Resp 17   SpO2 99%   Physical Exam CONSTITUTIONAL: Well developed, no distress HEAD: Normocephalic/atraumatic, no signs of trauma EYES: EOMI/PERRL, pupils dilated bilaterally ENMT: Mucous membranes moist NECK: supple no meningeal signs CV: S1/S2 noted, no murmurs/rubs/gallops noted LUNGS: Lungs are clear to auscultation bilaterally, no apparent distress ABDOMEN: soft, nontender NEURO: Pt is somnolent, no acute distress EXTREMITIES: No deformities noted SKIN: warm, color normal PSYCH: Unable to assess   ED Treatments / Results  Labs (all labs ordered are listed, but only abnormal results are displayed) Labs Reviewed  BASIC METABOLIC PANEL - Abnormal; Notable for the following components:  Result Value   Potassium 3.2 (*)    Glucose, Bld 123 (*)    Creatinine, Ser 0.50 (*)    All other components within normal limits  CBC - Abnormal; Notable for the following components:   WBC 3.7 (*)    Hemoglobin 12.4 (*)    HCT 37.8 (*)    All other components within normal limits  CK - Abnormal; Notable for the following components:   Total CK 17,670 (*)    All other components within normal limits  SARS CORONAVIRUS 2 (HOSPITAL ORDER, PERFORMED IN [facility])  BASIC  METABOLIC PANEL  RAPID URINE DRUG SCREEN, HOSP PERFORMED  ETHANOL    EKG None  Radiology No results found.  Procedures Procedures  Medications Ordered in ED Medications  0.9 %  sodium chloride infusion (has no administration in time range)  potassium chloride SA (K-DUR) CR tablet 40 mEq (has no administration in time range)  levETIRAcetam (KEPPRA) tablet 1,000 mg (has no administration in time range)  multivitamin with minerals tablet 1 tablet (has no administration in time range)  thiamine tablet 100 mg (has no administration in time range)  folic acid (FOLVITE) tablet 1 mg (has no administration in time range)  sodium chloride 0.9 % bolus 1,000 mL (0 mLs Intravenous Stopped 01/05/19 0108)  LORazepam (ATIVAN) injection 1 mg (1 mg Intravenous Given 01/05/19 0059)  sodium chloride 0.9 % bolus 1,000 mL (1,000 mLs Intravenous New Bag/Given 01/05/19 0140)     Initial Impression / Assessment and Plan / ED Course  I have reviewed the triage vital signs and the nursing notes.  Pertinent labs  results that were available during my care of the patient were reviewed by me and considered in my medical decision making (see chart for details).        12:29 AM Patient just left the hospital AGAINST MEDICAL ADVICE on May 30. Patient was found to have a seizure disorder was  started on Keppra.  Also had irregularity on brain MRI.  Patient also underwent an LP while in the hospital.  Patient was also having acute rhabdomyolysis while in the hospital.  Apparently the CK was worsening for patient.  Patient will likely need to be readmitted.  Patient is now postictal and resting comfortably 12:53 AM Patient is now awake and alert.  Patient has twitching and movement of the left leg.  This is a partial seizures patient had previously. He reports that he did take both doses of Keppra yesterday.  However the seizures came back tonight he thinks he had a bad reaction to Keppra.  Labs are pending at this  time. Will hold off on any further Keppra at this time 2:15 AM Total CK is over at 17,000 Will need to be admitted.  IV fluids ordered.  IV Ativan has been ordered.  Discussed with Dr. Julian ReilGardner for admission Final Clinical Impressions(s) / ED Diagnoses   Final diagnoses:  Seizure [facility](HCC)  Traumatic rhabdomyolysis, initial encounter [facility](HCC)    ED Discharge Orders    None       Zadie RhineWickline, Taccara Bushnell, MD 01/05/19 [phone]

## 2019-01-05 NOTE — ED Notes (Addendum)
Patient stated taken Keppra 1000 mg twice already today. Stated  Keppra "gives" her seizure. Educated patient on pharmaceutical effect for Keppra. Patient request ativan for "pain". Will notify ERMD.

## 2019-01-05 NOTE — Progress Notes (Signed)
Pt back to room from MRI. P. Amo Nysha Koplin RN °

## 2019-01-05 NOTE — ED Notes (Signed)
Gaylord Shih Wooderd (aunt) pt req to call for update [phone]

## 2019-01-06 LAB — COMPREHENSIVE METABOLIC PANEL
ALT: 154 U/L — ABNORMAL HIGH (ref 0–44)
AST: 226 U/L — ABNORMAL HIGH (ref 15–41)
Albumin: 3 g/dL — ABNORMAL LOW (ref 3.5–5.0)
Alkaline Phosphatase: 36 U/L — ABNORMAL LOW (ref 38–126)
Anion gap: 8 (ref 5–15)
BUN: 5 mg/dL — ABNORMAL LOW (ref 6–20)
CO2: 25 mmol/L (ref 22–32)
Calcium: 9.2 mg/dL (ref 8.9–10.3)
Chloride: 107 mmol/L (ref 98–111)
Creatinine, Ser: 0.48 mg/dL — ABNORMAL LOW (ref 0.61–1.24)
GFR calc Af Amer: >60 mL/min (ref >60)
GFR calc non Af Amer: >60 mL/min (ref >60)
Glucose, Bld: 80 mg/dL (ref 70–99)
Potassium: 3.4 mmol/L — ABNORMAL LOW (ref 3.5–5.1)
Sodium: 140 mmol/L (ref 135–145)
Total Bilirubin: 0.5 mg/dL (ref 0.3–1.2)
Total Protein: 7.3 g/dL (ref 6.5–8.1)

## 2019-01-06 LAB — PROLACTIN: Prolactin: 39.5 ng/mL — ABNORMAL HIGH (ref 4.0–15.2)

## 2019-01-06 LAB — CK: Total CK: 16464 U/L — ABNORMAL HIGH (ref 49–397)

## 2019-01-06 LAB — AMMONIA: Ammonia: 35 umol/L (ref 9–35)

## 2019-01-06 MED ORDER — ZOLPIDEM TARTRATE 5 MG PO TABS
5.0000 mg | ORAL_TABLET | Freq: Every evening | ORAL | Status: DC | PRN
  Administered 2019-01-06 (×2): 5 mg via ORAL
  Filled 2019-01-06 (×2): qty 1

## 2019-01-06 MED ORDER — POTASSIUM CHLORIDE CRYS ER 20 MEQ PO TBCR
40.0000 meq | EXTENDED_RELEASE_TABLET | Freq: Once | ORAL | Status: AC
  Administered 2019-01-06: 40 meq via ORAL
  Filled 2019-01-06: qty 2

## 2019-01-06 NOTE — Progress Notes (Signed)
patient states he does not want his new iv bag hooked up, patient sleeping

## 2019-01-06 NOTE — Progress Notes (Signed)
Pt refused his IV running fluids; pt educated on the need for the IV as it was ordered but he stated " I can drink water orally, I don't need this IV and I want it off". I fluid stopped per pt request. Dionne Bucy RN.

## 2019-01-06 NOTE — Progress Notes (Signed)
PIV consult: Pt declined at this time, asking for time to rest. Requested IV nurse come back "in about an hour".

## 2019-01-06 NOTE — Progress Notes (Addendum)
NEUROLOGY PROGRESS NOTE  Subjective: Patient is very drowsy but has no complaints.  Looking at chart she did receive 1 mg of Ativan at approximately 7:00 this morning.  Denies any complaints of muscle pain.  Exam: Vitals:   01/05/19 2357 01/06/19 0739  BP: 133/80 (!) 129/91  Pulse: 61 (!) 55  Resp: 18 20  Temp:  97.6 F (36.4 C)  SpO2: 100% 98%    Physical Exam   HEENT-  Normocephalic, no lesions, without obvious abnormality.  Normal external eye and conjunctiva.   Extremities- Warm, dry and intact Musculoskeletal-no joint tenderness, deformity or swelling Skin-warm and dry, no hyperpigmentation, vitiligo, or suspicious lesions    Neuro:  Mental Status: Alert, oriented, thought content appropriate.  Speech fluent without evidence of aphasia.  Able to follow 3 step commands without difficulty. Cranial Nerves: II:  Visual fields grossly normal,  III,IV, VI: ptosis not present, extra-ocular motions intact bilaterally pupils equal, round, reactive to light and accommodation V,VII: smile symmetric, facial light touch sensation normal bilaterally VIII: hearing normal bilaterally IX,X: Palate rises midline XI: bilateral shoulder shrug XII: midline tongue extension Motor: Right : Upper extremity   5/5    Left:     Upper extremity   5/5  Lower extremity   5/5     Lower extremity 4/5 strength  Sensory: Complains of reduced sensation to light touch in the left leg level above ankle and below. Deep Tendon Reflexes: 2+ and symmetric throughout Plantars: Right: downgoing   Left: downgoing Cerebellar: normal finger-to-nose,     Medications:  Scheduled: . divalproex  500 mg Oral Q12H  . folic acid  1 mg Oral Daily  . levETIRAcetam  1,000 mg Oral BID  . multivitamin with minerals  1 tablet Oral Daily  . thiamine  100 mg Oral Daily   Continuous: . sodium chloride 150 mL/hr at 01/06/19 3151    Pertinent Labs/Diagnostics: VOH:YWVP electroencephalogram is consistent with  medication effects.  No epileptiform activity is noted.   -AST 226 -ALT 154 -Creatinine 1.48 -CK 16,464  Mr Brain Wo Contrast  Result Date: 01/05/2019  IMPRESSION: Mild interval improvement of right parietal cortical signal abnormality which may be related to ongoing seizure activity. No new intracranial abnormality. Electronically Signed   By: Sebastian Ache M.D.   On: 01/05/2019 15:18     Felicie Morn PA-C [facility] [phone]   Assessment: 23 year old male with new onset partial seizure.  EEG is negative and MRI shows improvement of the right parietal cortical signal abnormality which likely is secondary to the ongoing seizure activity.  Patient has had no further left leg simple partial seizures since admission.  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 Use caution when using heavy equipment or power tools. Avoid working on ladders or at heights. Take showers instead of baths. Ensure the water temperature is not too high on the home water heater. Do not go swimming alone. When caring for infants or small children, sit down when holding, feeding, or changing them to minimize risk of injury to the child in the event you have a seizure.   Also, Maintain good sleep hygiene. Avoid alcohol.  NEUROHOSPITALIST ADDENDUM Performed a face to face  diagnostic evaluation.   I have reviewed the contents of history and physical exam as documented by PA/ARNP/Resident and agree with above documentation.  I have discussed and formulated the above plan as documented. Edits to the note have been made as needed.   Patient has had no further  seizures and has responded well to valproic acid.  He still appears to be weaker on the left leg, however slightly improved compared to yesterday.  Recommend PT OT. Check VPA levels tomorrow.  If patient remains seizure-free I do think we can start working towards discharging patient I have explained to him seizure precautions as well as importance of medication compliance, avoiding drugs including marijuana.  He will need close follow-up with outpatient neurologist.   Georgiana SpinnerSushanth Aroor MD [facility] [phone]   If 7pm to 7am, please call on call as listed on AMION.   01/06/2019, 9:50 AM

## 2019-01-06 NOTE — Progress Notes (Signed)
Pt was very challenging get easily irritable and did not wants to be bordred. Turn IV off several times as the nurse does his round stating it beeps and no one comes to turn it on.. Nurse told him to call but he still turned it off most of the night. Po fluid encouraged. No seizure activity noted.

## 2019-01-06 NOTE — Progress Notes (Signed)
Pt had his telemetry removed upon change of shift; pt is refusing to have his telemetry put on; he said he don't need it and requesting for it to be discontinued. NP on-call paged and notified; will continue to monitor. Dionne Bucy RN

## 2019-01-06 NOTE — Progress Notes (Signed)
PROGRESS NOTE    Jeremy Lane  FAO:[id number] DOB: [birth year] DOA: 01/04/2019 PCP: Patient, No Pcp Per   Brief Narrative:  HPI on 01/05/2019 by Dr. Lyda Perone Jeremy Lane is a 23 y.o. male with medical history significant of HTN, EtOH abuse, also uses cocaine and marijuana.  Patient was just admitted from 5/23-5/30 for partial status epilepticus, new diagnosis of HIV.  For a full review of that admission, see the DC summary from 5/30, but briefly: patient presented on 5/23 with 5 day h/o intractable twitching of LEFT leg, patient given fosphenytoin initially, seizures stopped but had itching.  Patient put on 1g keppra BID.  Patient had some ongoing intermittent twitching in his L leg later in the admission.  EEG was negative but MRI was positive for cortical edema likely related to his seizure state.  Additionally his CPK began to rise and trended up to 14k during the admission.  Patient left AMA on 5/30.  His partial seizures have persisted at home so he returns to ED.  Interim history Readmitted for seizure. Neurology consulted   Assessment & Plan   Status Epilepticus/ongoing seizures -Patient left AMA and returned due to further ongoing twitching in his left leg -Patient was given Ativan in the emergency apartment to stop the seizures -Continue Keppra 1000 mg twice daily -Neurology consulted and appreciated, Depakote has been added, continue keppra and PRN ativan -on previous admission: EEG unremarkable, MRI showed subcortical edema in the right parietal lobe-of note repeat MRI in 2 to 3 months -MRI on 01/05/2019 showed mild interval improvement of the right parietal cortical signal abnormality which may be related to ongoing seizure activity.  No new intracranial abnormality. -Patient still requiring as needed Ativan  Rhabdomyolysis -Secondary to the above -CK level 16,464 -Continue IV fluids monitor CK levels  HIV disease -follow-up with infectious disease as an  outpatient  Alcohol/polysubstance abuse -Continue thiamine, folic acid, multivitamin,CIWA protocol -UDS positive for THC  Essential hypertension -BP stable, currently on no medication  Elevated LFTs -Likely secondary to acute rhabdomyolysis -Continue to monitor  DVT Prophylaxis  SCDs  Code Status: Full  Family Communication: None at bedside  Disposition Plan: Admitted.   Consultants Neurology   Procedures  none  Antibiotics   Anti-infectives (From admission, onward)   None      Subjective:   Jeremy Lane seen and examined today.   No complaints other than feeling very sleepy and tired this morning.  States that currently he does not have any muscle twitching although he did receive Ativan this morning.  He denies current chest pain, shortness of breath, abdominal pain, nausea vomiting, diarrhea or constipation, dizziness or headache.  Objective:   Vitals:   01/05/19 1646 01/05/19 1937 01/05/19 2357 01/06/19 0739  BP: 117/78 129/65 133/80 (!) 129/91  Pulse: (!) 112 73 61 (!) 55  Resp: Temp: 98.5 F (36.9 C) 97.9 F (36.6 C) 98.6 F (37 C) 97.6 F (36.4 C)  TempSrc: Oral Oral Oral Oral  SpO2: 100% 100% 100% 98%    Intake/Output Summary (Last 24 hours) at 01/06/2019 1335 Last data filed at 01/06/2019 0600 Gross per 24 hour  Intake 1642.76 ml  Output 1100 ml  Net 542.76 ml   There were no vitals filed for this visit.  Exam  General: Well developed, well nourished, NAD, appears stated age  HEENT: NCAT, mucous membranes moist.   Cardiovascular: S1 S2 auscultated, RRR, no murmur  Respiratory: Clear to auscultation bilaterally with equal  chest rise  Abdomen: Soft, nontender, nondistended, + bowel sounds  Extremities: warm dry without cyanosis clubbing or edema  Neuro: AAOx3, nonfocal  Psych: Appropriate mood and affect  Data Reviewed: I have personally reviewed following labs and imaging studies  CBC: Recent Labs  Lab  01/04/19 2211  WBC 3.7*  HGB 12.4*  HCT 37.8*  MCV 83.8  PLT 194   Basic Metabolic Panel: Recent Labs  Lab 01/02/19 0443 01/03/19 0412 01/04/19 2211 01/05/19 0518 01/06/19 0317  NA 138 140 139 140 140  K 3.2* 3.8 3.2* 3.1* 3.4*  CL 107 112* 104 109 107  CO2 GLUCOSE 111* 90 123* 97 80  BUN 7 5* 9 6 5*  CREATININE 0.40* 0.44* 0.50* 0.43* 0.48*  CALCIUM 9.0 8.8* 9.5 8.3* 9.2   GFR: Estimated Creatinine Clearance: 129.6 mL/min (A) (by C-G formula based on SCr of 0.48 mg/dL (L)). Liver Function Tests: Recent Labs  Lab 01/01/19 0412 01/02/19 0443 01/03/19 0412 01/06/19 0317  AST 116* 162* 183* 226*  ALT 112* 123* 134* 154*  ALKPHOS 35* 36* 40 36*  BILITOT 0.6 0.4 0.3 0.5  PROT 7.2 6.6 7.0 7.3  ALBUMIN 3.0* 3.0* 3.1* 3.0*   No results for input(s): LIPASE, AMYLASE in the last 168 hours. No results for input(s): AMMONIA in the last 168 hours. Coagulation Profile: No results for input(s): INR, PROTIME in the last 168 hours. Cardiac Enzymes: Recent Labs  Lab 01/03/19 0412 01/03/19 1352 01/04/19 2211 01/05/19 0518 01/06/19 0317  CKTOTAL 14,320* 11,185* 17,670* 16,204* 16,464*   BNP (last 3 results) No results for input(s): PROBNP in the last 8760 hours. HbA1C: No results for input(s): HGBA1C in the last 72 hours. CBG: Recent Labs  Lab 01/03/19 0800  GLUCAP 81   Lipid Profile: No results for input(s): CHOL, HDL, LDLCALC, TRIG, CHOLHDL, LDLDIRECT in the last 72 hours. Thyroid Function Tests: No results for input(s): TSH, T4TOTAL, FREET4, T3FREE, THYROIDAB in the last 72 hours. Anemia Panel: No results for input(s): VITAMINB12, FOLATE, FERRITIN, TIBC, IRON, RETICCTPCT in the last 72 hours. Urine analysis:    Component Value Date/Time   COLORURINE YELLOW 12/22/2018 1111   APPEARANCEUR CLEAR 12/22/2018 1111   LABSPEC 1.021 12/22/2018 1111   PHURINE 5.0 12/22/2018 1111   GLUCOSEU NEGATIVE 12/22/2018 1111   HGBUR MODERATE (A) 12/22/2018  1111   BILIRUBINUR NEGATIVE 12/22/2018 1111   KETONESUR NEGATIVE 12/22/2018 1111   PROTEINUR NEGATIVE 12/22/2018 1111   NITRITE NEGATIVE 12/22/2018 1111   LEUKOCYTESUR NEGATIVE 12/22/2018 1111   Sepsis Labs: (procalcitonin:4,lacticidven:4)  ) Recent Results (from the past 240 hour(s))  CSF culture     Status: None   Collection Time: 12/27/18  1:45 PM  Result Value Ref Range Status   Specimen Description CSF  Final   Special Requests NONE  Final   Gram Stain NO WBC SEEN NO ORGANISMS SEEN CYTOSPIN SMEAR   Final   Culture   Final    NO GROWTH 3 DAYS Performed at [facility], [address] [place], [place] [address]    Report Status 12/30/2018 FINAL  Final  SARS Coronavirus 2 (CEPHEID - Performed in [facility] hospital lab), Hosp Order     Status: None   Collection Time: 01/05/19  2:13 AM  Result Value Ref Range Status   SARS Coronavirus 2 NEGATIVE NEGATIVE Final    Comment: (NOTE) If result is NEGATIVE SARS-CoV-2 target nucleic acids are NOT DETECTED. The SARS-CoV-2 RNA is generally detectable  in upper and lower  respiratory specimens during the acute phase of infection. The lowest  concentration of SARS-CoV-2 viral copies this assay can detect is 250  copies / mL. A negative result does not preclude SARS-CoV-2 infection  and should not be used as the sole basis for treatment or other  patient management decisions.  A negative result may occur with  improper specimen collection / handling, submission of specimen other  than nasopharyngeal swab, presence of viral mutation(s) within the  areas targeted by this assay, and inadequate number of viral copies  (<250 copies / mL). A negative result must be combined with clinical  observations, patient history, and epidemiological information. If result is POSITIVE SARS-CoV-2 target nucleic acids are DETECTED. The SARS-CoV-2 RNA is generally detectable in upper and lower  respiratory specimens dur ing the acute  phase of infection.  Positive  results are indicative of active infection with SARS-CoV-2.  Clinical  correlation with patient history and other diagnostic information is  necessary to determine patient infection status.  Positive results do  not rule out bacterial infection or co-infection with other viruses. If result is PRESUMPTIVE POSTIVE SARS-CoV-2 nucleic acids MAY BE PRESENT.   A presumptive positive result was obtained on the submitted specimen  and confirmed on repeat testing.  While 2019 novel coronavirus  (SARS-CoV-2) nucleic acids may be present in the submitted sample  additional confirmatory testing may be necessary for epidemiological  and / or clinical management purposes  to differentiate between  SARS-CoV-2 and other Sarbecovirus currently known to infect humans.  If clinically indicated additional testing with an alternate test  methodology [phone]) is advised. The SARS-CoV-2 RNA is generally  detectable in upper and lower respiratory sp ecimens during the acute  phase of infection. The expected result is Negative. Fact Sheet for Patients:  BoilerBrush.com.cy Fact Sheet for Healthcare Providers: https://pope.com/ This test is not yet approved or cleared by the [place] FDA and has been authorized for detection and/or diagnosis of SARS-CoV-2 by FDA under an Emergency Use Authorization (EUA).  This EUA will remain in effect (meaning this test can be used) for the duration of the COVID-19 declaration under Section 564(b)(1) of the Act, 21 U.S.C. section 360bbb-3(b)(1), unless the authorization is terminated or revoked sooner. Performed at [facility], [address] [place], [place] [address]       Radiology Studies: Mr Brain 72 Contrast  Result Date: 01/05/2019 CLINICAL DATA:  New onset partial seizures with right parietal cortical signal abnormality on MRI. Persistent leg twitching. EXAM: MRI HEAD  WITHOUT CONTRAST TECHNIQUE: Multiplanar, multiecho pulse sequences of the brain and surrounding structures were obtained without intravenous contrast. COMPARISON:  12/27/2018 FINDINGS: The study is intermittently up to moderately motion degraded. Brain: There is a region of persistent cortical/T2 hyperintensity in the right parietal lobe which has mildly decreased in extent. Associated trace diffusion hyperintensity is also decreased without restricted diffusion. There is no significant gyral enlargement. The brain is normal in signal elsewhere without evidence of acute infarct, intracranial hemorrhage, mass effect, or extra-axial fluid collection. The ventricles and sulci are normal in size. Vascular: Major intracranial vascular flow voids are preserved. Skull and upper cervical spine: Unremarkable bone marrow signal. Sinuses/Orbits: Unremarkable orbits. Mild mucosal thickening in the paranasal sinuses. Clear mastoid air cells. Other: Similar appearance of borderline to mildly enlarged lymph nodes in the upper neck bilaterally. IMPRESSION: Mild interval improvement of right parietal cortical signal abnormality which may be related to ongoing seizure activity. No  new intracranial abnormality. Electronically Signed   By: Sebastian AcheAllen  Grady M.D.   On: 01/05/2019 15:18     Scheduled Meds:  divalproex  500 mg Oral Q12H   folic acid  1 mg Oral Daily   levETIRAcetam  1,000 mg Oral BID   multivitamin with minerals  1 tablet Oral Daily   thiamine  100 mg Oral Daily   Continuous Infusions:  sodium chloride Stopped (01/06/19 1237)     LOS: 1 day   Time Spent in minutes   30 minutes  Guillermina Shaft D.O. on 01/06/2019 at 1:35 PM  Between 7am to 7pm - Please see pager noted on [email or web address]  After 7pm go to [email or web address]  And look for the night coverage person covering for me after hours  [facility] Office  [phone]

## 2019-01-07 LAB — CMV DNA, QUANTITATIVE, PCR
CMV DNA Quant: NEGATIVE IU/mL
Log10 CMV Qn DNA Pl: UNDETERMINED log10 IU/mL

## 2019-01-07 LAB — CK: Total CK: 13988 U/L — ABNORMAL HIGH (ref 49–397)

## 2019-01-07 LAB — COMPREHENSIVE METABOLIC PANEL
ALT: 170 U/L — ABNORMAL HIGH (ref 0–44)
AST: 248 U/L — ABNORMAL HIGH (ref 15–41)
Albumin: 3.3 g/dL — ABNORMAL LOW (ref 3.5–5.0)
Alkaline Phosphatase: 37 U/L — ABNORMAL LOW (ref 38–126)
Anion gap: 9 (ref 5–15)
BUN: 7 mg/dL (ref 6–20)
CO2: 24 mmol/L (ref 22–32)
Calcium: 9.2 mg/dL (ref 8.9–10.3)
Chloride: 105 mmol/L (ref 98–111)
Creatinine, Ser: 0.44 mg/dL — ABNORMAL LOW (ref 0.61–1.24)
GFR calc Af Amer: >60 mL/min (ref >60)
GFR calc non Af Amer: >60 mL/min (ref >60)
Glucose, Bld: 87 mg/dL (ref 70–99)
Potassium: 3.6 mmol/L (ref 3.5–5.1)
Sodium: 138 mmol/L (ref 135–145)
Total Bilirubin: 0.5 mg/dL (ref 0.3–1.2)
Total Protein: 7.5 g/dL (ref 6.5–8.1)

## 2019-01-07 LAB — VALPROIC ACID LEVEL: Valproic Acid Lvl: 55 ug/mL (ref 50.0–100.0)

## 2019-01-07 MED ORDER — THIAMINE HCL 100 MG PO TABS: 100.0000 mg | ORAL_TABLET | Freq: Every day | ORAL | 0 refills | Status: DC

## 2019-01-07 MED ORDER — LORAZEPAM 1 MG PO TABS
1.0000 mg | ORAL_TABLET | Freq: Four times a day (QID) | ORAL | Status: DC | PRN
  Administered 2019-01-07: 1 mg via ORAL
  Filled 2019-01-07 (×2): qty 1

## 2019-01-07 MED ORDER — DIVALPROEX SODIUM 500 MG PO DR TAB: 500.0000 mg | DELAYED_RELEASE_TABLET | Freq: Two times a day (BID) | ORAL | 0 refills | Status: DC

## 2019-01-07 NOTE — Discharge Summary (Signed)
Physician Discharge Summary  Jeremy Lane VVK:[id number] DOB: July 13, 1996 DOA: 01/04/2019  PCP: Patient, No Pcp Per  Admit date: 01/04/2019 Discharge date: 01/07/2019  Admitted From: Home Disposition:  Home   Recommendations for Outpatient Follow-up:  1. Follow up with PCP in 1-2 weeks 2. Please obtain BMP/CBC in one week 3. Needs to follow up with ID to start HIV meds. 4. Needs to follow up with neurology for further adjustment of seizure medications.   Home Health: none  Discharge Condition: Stable.  CODE STATUS: full code Diet recommendation: Heart Healthy    Brief/Interim Summary: HPI on 01/05/2019 by Dr. Rockney Ghee Thorntonis a 23 y.o.malewith medical history significant ofHTN, EtOH abuse, also uses cocaine and marijuana. Patient was just admitted from 5/23-5/30 for partial status epilepticus, new diagnosis of HIV.  For a full review of that admission, see the DC summary from 5/30, but briefly: patient presented on 5/23 with 5 day h/o intractable twitching of LEFT leg, patient given fosphenytoin initially, seizures stopped but had itching. Patient put on 1g keppra BID. Patient had some ongoing intermittent twitching in his L leg later in the admission. EEG was negative but MRI was positive for cortical edema likely related to his seizure state. Additionally his CPK began to rise and trended up to 14k during the admission. Patient left AMA on 5/30.  His partial seizures have persisted at home so he returns to ED.  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 -Patient was given Ativan in the emergency apartment to stop the seizures -Continue Keppra 1000 mg twice  daily, home dose.  -Neurology consulted and appreciated, Depakote was added, continue keppra and PRN ativan -on previous admission: EEG unremarkable, MRI showed subcortical edema in the right parietal lobe-of note repeat MRI in 2 to 3 months -MRI on 01/05/2019 showed mild interval improvement of the right parietal cortical signal abnormality which may be related to ongoing seizure activity.  No new intracranial abnormality. -seizure free today. Neuro exam non focal. Evaluated by neurology today, ok to discharge home today. I have provided prescriptions for Depakote.   Rhabdomyolysis -Secondary to the above -CK level 16,464---trending down 13000.  -Refuse IV access, refuse IV fluids. I  Advised patient to drink plenty of fluid.   HIV disease -follow-up with infectious disease as an outpatient -Patient aware of appointment on June 11 at 2 pm.   Alcohol/polysubstance abuse -Continue thiamine, folic acid, multivitamin,CIWA protocol -UDS positive for THC  Essential hypertension -BP stable, currently on no medication  Elevated LFTs -Likely secondary to acute rhabdomyolysis -Continue to monitor   Discharge Instructions  Discharge Instructions    Diet - low sodium heart healthy   Complete by:  As directed    Increase activity slowly   Complete by:  As directed      Allergies as of 01/07/2019      Reactions   Fosphenytoin Itching      Medication List    TAKE these medications   divalproex 500 MG DR tablet Commonly known as:  DEPAKOTE Take 1 tablet (500 mg total) by mouth every 12 (twelve) hours.   folic acid 1 MG tablet Commonly known as:  FOLVITE Take 1 tablet (1 mg total) by mouth  daily.   levETIRAcetam 1000 MG tablet Commonly known as:  KEPPRA Take 1 tablet (1,000 mg total) by mouth 2 (two) times daily.   multivitamin with minerals Tabs tablet Take 1 tablet by mouth daily.   thiamine 100 MG tablet Take 1 tablet (100 mg total) by mouth daily.      Follow-up  Information    Jeremy Smart, MD Follow up.   Specialty:  Infectious Diseases Why:  appointment 6-11 at 2 PM  Contact information: [address] [place] [address] [phone]          Allergies  Allergen Reactions  . Fosphenytoin Itching    Consultations:  Neurology    Procedures/Studies: Dg Ribs Unilateral W/chest Left  Result Date: 12/22/2018 CLINICAL DATA:  Left rib pain after fall. EXAM: LEFT RIBS AND CHEST - 3+ VIEW COMPARISON:  None. FINDINGS: No fracture or other bone lesions are seen involving the ribs. There is no evidence of pneumothorax or pleural effusion. Both lungs are clear. Heart size and mediastinal contours are within normal limits. IMPRESSION: Negative. Electronically Signed   By: Lupita Raider M.D.   On: 12/22/2018 12:04   Ct Head Wo Contrast  Result Date: 12/22/2018 CLINICAL DATA:  Syncopal episode with suspected seizure EXAM: CT HEAD WITHOUT CONTRAST TECHNIQUE: Contiguous axial images were obtained from the base of the skull through the vertex without intravenous contrast. COMPARISON:  None. FINDINGS: Brain: The ventricles are normal in size and configuration. There is no intracranial mass, hemorrhage, extra-axial fluid collection, or midline shift. The brain parenchyma appears unremarkable. No evident acute infarct. Vascular: No hyperdense vessel. There is no demonstrable vascular calcification. Skull: The bony calvarium appears intact. Sinuses/Orbits: There is opacification in several ethmoid air cells bilaterally. There is mucosal thickening in the anterior superior right maxillary antrum. Orbits appear symmetric bilaterally. Other: Mastoid air cells are clear. IMPRESSION: Areas of paranasal sinus disease.  Study otherwise unremarkable. Electronically Signed   By: Bretta Bang III M.D.   On: 12/22/2018 13:09   Mr Brain Wo Contrast  Result Date: 01/05/2019 CLINICAL DATA:  New onset partial seizures with right parietal cortical  signal abnormality on MRI. Persistent leg twitching. EXAM: MRI HEAD WITHOUT CONTRAST TECHNIQUE: Multiplanar, multiecho pulse sequences of the brain and surrounding structures were obtained without intravenous contrast. COMPARISON:  12/27/2018 FINDINGS: The study is intermittently up to moderately motion degraded. Brain: There is a region of persistent cortical/T2 hyperintensity in the right parietal lobe which has mildly decreased in extent. Associated trace diffusion hyperintensity is also decreased without restricted diffusion. There is no significant gyral enlargement. The brain is normal in signal elsewhere without evidence of acute infarct, intracranial hemorrhage, mass effect, or extra-axial fluid collection. The ventricles and sulci are normal in size. Vascular: Major intracranial vascular flow voids are preserved. Skull and upper cervical spine: Unremarkable bone marrow signal. Sinuses/Orbits: Unremarkable orbits. Mild mucosal thickening in the paranasal sinuses. Clear mastoid air cells. Other: Similar appearance of borderline to mildly enlarged lymph nodes in the upper neck bilaterally. IMPRESSION: Mild interval improvement of right parietal cortical signal abnormality which may be related to ongoing seizure activity. No new intracranial abnormality. Electronically Signed   By: Sebastian Ache M.D.   On: 01/05/2019 15:18   Mr Brain W And Wo Contrast  Result Date: 12/27/2018 CLINICAL DATA:  Seizure 12/22/2018.  Left leg pain. EXAM: MRI HEAD WITHOUT AND WITH CONTRAST TECHNIQUE: Multiplanar, multiecho pulse sequences of the brain and surrounding structures were obtained without and  with intravenous contrast. CONTRAST:  6 mL Gadovist IV COMPARISON:  CT head 12/22/2018 FINDINGS: Brain: Cortical edema in the high right parietal lobe with relative sparing of the white matter. No associated restricted diffusion or enhancement. This area measures approximately 2.5 cm in diameter. No other areas of cortical edema.  Normal white matter. Negative for acute infarct. No demyelinating disease or hemorrhage. Normal enhancement postcontrast administration. Vascular: Normal arterial flow voids. Normal venous sinus enhancement. Skull and upper cervical spine: Negative Sinuses/Orbits: Prominent hypertrophy of the adenoid tissue. Mucosal edema paranasal sinuses. Negative orbit. Mastoid clear bilaterally Other: None IMPRESSION: Cortical edema high right parietal lobe without restricted diffusion or enhancement. This is likely related to the patient's recent seizure activity. Differential diagnosis includes encephalitis and low-grade tumor. Follow-up MRI brain without with contrast recommended 2-3 months to evaluate for resolution. Electronically Signed   By: Marlan Palau M.D.   On: 12/27/2018 07:32   US Abdomen Limited  Result Date: 12/29/2018 CLINICAL DATA:  Initial evaluation for elevated LFTs. EXAM: ULTRASOUND ABDOMEN LIMITED RIGHT UPPER QUADRANT COMPARISON:  None available. FINDINGS: Gallbladder: No gallstones or wall thickening visualized. No sonographic Murphy sign noted by sonographer. Common bile duct: Diameter: 2.4 mm Liver: No focal lesion identified. Within normal limits in parenchymal echogenicity. Portal vein is patent on color Doppler imaging with normal direction of blood flow towards the liver. IMPRESSION: Normal right upper quadrant ultrasound. Electronically Signed   By: Rise Mu M.D.   On: 12/29/2018 06:59   Dg Fluoro Guide Lumbar Puncture  Result Date: 12/27/2018 CLINICAL DATA:  Seizure.  Abnormal brain MRI. EXAM: LUMBAR PUNCTURE UNDER FLUOROSCOPY FLUOROSCOPY TIME:  0.1 minute; 15 uGym2 DAP TECHNIQUE: The procedure, risks (including but not limited to bleeding, infection, organ damage ), benefits, and alternatives were explained to the patient. Questions regarding the procedure were encouraged and answered. The patient understands and consents to the procedure. An appropriate skin entry site was  determined fluoroscopically. Operator donned sterile gloves and mask. Skin site was marked, then prepped with Betadine, draped in usual sterile fashion, and infiltrated locally with 1% lidocaine. A 20 gauge spinal needle advanced into the thecal sac at L3-4 from a right interlaminar approach. Clear colorless CSF spontaneously returned, with opening pressure of 12 cm water. 9ml CSF were collected and divided among 4 sterile vials for the requested laboratory studies. The needle was then removed. COMPLICATIONS: None immediate IMPRESSION: 1. Technically successful lumbar puncture under fluoroscopy. Electronically Signed   By: Corlis Leak M.D.   On: 12/27/2018 14:02      Subjective: Relates spasm left leg.   Discharge Exam: Vitals:   01/07/19 0843 01/07/19 1202  BP: 114/60 117/78  Pulse: (!) 57 76  Resp: 20 20  Temp: 97.6 F (36.4 C) 98.1 F (36.7 C)  SpO2: 100% 100%     General: Pt is alert, awake, not in acute distress Cardiovascular: RRR, S1/S2 +, no rubs, no gallops Respiratory: CTA bilaterally, no wheezing, no rhonchi Abdominal: Soft, NT, ND, bowel sounds + Extremities: no edema, no cyanosis    The results of significant diagnostics from this hospitalization (including imaging, microbiology, ancillary and laboratory) are listed below for reference.     Microbiology: Recent Results (from the past 240 hour(s))  SARS Coronavirus 2 (CEPHEID - Performed in [facility] hospital lab), Hosp Order     Status: None   Collection Time: 01/05/19  2:13 AM  Result Value Ref Range Status   SARS Coronavirus 2 NEGATIVE NEGATIVE Final    Comment: (  NOTE) If result is NEGATIVE SARS-CoV-2 target nucleic acids are NOT DETECTED. The SARS-CoV-2 RNA is generally detectable in upper and lower  respiratory specimens during the acute phase of infection. The lowest  concentration of SARS-CoV-2 viral copies this assay can detect is 250  copies / mL. A negative result does not preclude SARS-CoV-2  infection  and should not be used as the sole basis for treatment or other  patient management decisions.  A negative result may occur with  improper specimen collection / handling, submission of specimen other  than nasopharyngeal swab, presence of viral mutation(s) within the  areas targeted by this assay, and inadequate number of viral copies  (<250 copies / mL). A negative result must be combined with clinical  observations, patient history, and epidemiological information. If result is POSITIVE SARS-CoV-2 target nucleic acids are DETECTED. The SARS-CoV-2 RNA is generally detectable in upper and lower  respiratory specimens dur ing the acute phase of infection.  Positive  results are indicative of active infection with SARS-CoV-2.  Clinical  correlation with patient history and other diagnostic information is  necessary to determine patient infection status.  Positive results do  not rule out bacterial infection or co-infection with other viruses. If result is PRESUMPTIVE POSTIVE SARS-CoV-2 nucleic acids MAY BE PRESENT.   A presumptive positive result was obtained on the submitted specimen  and confirmed on repeat testing.  While 2019 novel coronavirus  (SARS-CoV-2) nucleic acids may be present in the submitted sample  additional confirmatory testing may be necessary for epidemiological  and / or clinical management purposes  to differentiate between  SARS-CoV-2 and other Sarbecovirus currently known to infect humans.  If clinically indicated additional testing with an alternate test  methodology [phone]([id number]) is advised. The SARS-CoV-2 RNA is generally  detectable in upper and lower respiratory sp ecimens during the acute  phase of infection. The expected result is Negative. Fact Sheet for Patients:  BoilerBrush.com.cyhttps://www.fda.gov/media/136312/download Fact Sheet for Healthcare Providers: https://pope.com/https://www.fda.gov/media/136313/download This test is not yet approved or cleared by the [place]  FDA and has been authorized for detection and/or diagnosis of SARS-CoV-2 by FDA under an Emergency Use Authorization (EUA).  This EUA will remain in effect (meaning this test can be used) for the duration of the COVID-19 declaration under Section 564(b)(1) of the Act, 21 U.S.C. section 360bbb-3(b)(1), unless the authorization is terminated or revoked sooner. Performed at [facility], [address] [place], [place] [address]      Labs: BNP (last 3 results) No results for input(s): BNP in the last 8760 hours. Basic Metabolic Panel: Recent Labs  Lab 01/03/19 0412 01/04/19 2211 01/05/19 0518 01/06/19 0317 01/07/19 0233  NA 140 139 140 140 138  K 3.8 3.2* 3.1* 3.4* 3.6  CL 112* 104 109 107 105  CO2 22 24 24 25 24   GLUCOSE 90 123* 97 80 87  BUN 5* 9 6 5* 7  CREATININE 0.44* 0.50* 0.43* 0.48* 0.44*  CALCIUM 8.8* 9.5 8.3* 9.2 9.2   Liver Function Tests: Recent Labs  Lab 01/01/19 0412 01/02/19 0443 01/03/19 0412 01/06/19 0317 01/07/19 0233  AST 116* 162* 183* 226* 248*  ALT 112* 123* 134* 154* 170*  ALKPHOS 35* 36* 40 36* 37*  BILITOT 0.6 0.4 0.3 0.5 0.5  PROT 7.2 6.6 7.0 7.3 7.5  ALBUMIN 3.0* 3.0* 3.1* 3.0* 3.3*   No results for input(s): LIPASE, AMYLASE in the last 168 hours. Recent Labs  Lab 01/06/19 1532  AMMONIA 35   CBC:  Recent Labs  Lab 01/04/19 2211  WBC 3.7*  HGB 12.4*  HCT 37.8*  MCV 83.8  PLT 194   Cardiac Enzymes: Recent Labs  Lab 01/03/19 1352 01/04/19 2211 01/05/19 0518 01/06/19 0317 01/07/19 0233  CKTOTAL 11,185* 17,670* 16,204* 16,464* 13,988*   BNP: Invalid input(s): POCBNP CBG: Recent Labs  Lab 01/03/19 0800  GLUCAP 81   D-Dimer No results for input(s): DDIMER in the last 72 hours. Hgb A1c No results for input(s): HGBA1C in the last 72 hours. Lipid Profile No results for input(s): CHOL, HDL, LDLCALC, TRIG, CHOLHDL, LDLDIRECT in the last 72 hours. Thyroid function studies No results for input(s): TSH, T4TOTAL,  T3FREE, THYROIDAB in the last 72 hours.  Invalid input(s): FREET3 Anemia work up No results for input(s): VITAMINB12, FOLATE, FERRITIN, TIBC, IRON, RETICCTPCT in the last 72 hours. Urinalysis    Component Value Date/Time   COLORURINE YELLOW 12/22/2018 1111   APPEARANCEUR CLEAR 12/22/2018 1111   LABSPEC 1.021 12/22/2018 1111   PHURINE 5.0 12/22/2018 1111   GLUCOSEU NEGATIVE 12/22/2018 1111   HGBUR MODERATE (A) 12/22/2018 1111   BILIRUBINUR NEGATIVE 12/22/2018 1111   KETONESUR NEGATIVE 12/22/2018 1111   PROTEINUR NEGATIVE 12/22/2018 1111   NITRITE NEGATIVE 12/22/2018 1111   LEUKOCYTESUR NEGATIVE 12/22/2018 1111   Sepsis Labs Invalid input(s): PROCALCITONIN,  WBC,  LACTICIDVEN Microbiology Recent Results (from the past 240 hour(s))  SARS Coronavirus 2 (CEPHEID - Performed in [facility] hospital lab), Hosp Order     Status: None   Collection Time: 01/05/19  2:13 AM  Result Value Ref Range Status   SARS Coronavirus 2 NEGATIVE NEGATIVE Final    Comment: (NOTE) If result is NEGATIVE SARS-CoV-2 target nucleic acids are NOT DETECTED. The SARS-CoV-2 RNA is generally detectable in upper and lower  respiratory specimens during the acute phase of infection. The lowest  concentration of SARS-CoV-2 viral copies this assay can detect is 250  copies / mL. A negative result does not preclude SARS-CoV-2 infection  and should not be used as the sole basis for treatment or other  patient management decisions.  A negative result may occur with  improper specimen collection / handling, submission of specimen other  than nasopharyngeal swab, presence of viral mutation(s) within the  areas targeted by this assay, and inadequate number of viral copies  (<250 copies / mL). A negative result must be combined with clinical  observations, patient history, and epidemiological information. If result is POSITIVE SARS-CoV-2 target nucleic acids are DETECTED. The SARS-CoV-2 RNA is generally detectable in  upper and lower  respiratory specimens dur ing the acute phase of infection.  Positive  results are indicative of active infection with SARS-CoV-2.  Clinical  correlation with patient history and other diagnostic information is  necessary to determine patient infection status.  Positive results do  not rule out bacterial infection or co-infection with other viruses. If result is PRESUMPTIVE POSTIVE SARS-CoV-2 nucleic acids MAY BE PRESENT.   A presumptive positive result was obtained on the submitted specimen  and confirmed on repeat testing.  While 2019 novel coronavirus  (SARS-CoV-2) nucleic acids may be present in the submitted sample  additional confirmatory testing may be necessary for epidemiological  and / or clinical management purposes  to differentiate between  SARS-CoV-2 and other Sarbecovirus currently known to infect humans.  If clinically indicated additional testing with an alternate test  methodology [phone]) is advised. The SARS-CoV-2 RNA is generally  detectable in upper and lower respiratory sp ecimens during the acute  phase of infection. The expected result is Negative. Fact Sheet for Patients:  BoilerBrush.com.cy Fact Sheet for Healthcare Providers: https://pope.com/ This test is not yet approved or cleared by the [place] FDA and has been authorized for detection and/or diagnosis of SARS-CoV-2 by FDA under an Emergency Use Authorization (EUA).  This EUA will remain in effect (meaning this test can be used) for the duration of the COVID-19 declaration under Section 564(b)(1) of the Act, 21 U.S.C. section 360bbb-3(b)(1), unless the authorization is terminated or revoked sooner. Performed at [facility], [address] [place], [place] [address]      Time coordinating discharge: 40 minutes  SIGNED:   Alba Cory, MD  [facility]

## 2019-01-07 NOTE — Plan of Care (Signed)
Adequate for discharge.

## 2019-01-07 NOTE — Evaluation (Signed)
Physical Therapy Evaluation Patient Details Name: Jeremy Lane MRN: [id number] DOB: Sep 10, 1995 Today's Date: 01/07/2019   History of Present Illness  Pt is a 23 y/o male admitted secondary to seizure like activity. Workup in process. MRI revealed Cortical edema high right parietal lobe. Differential includes right temporal lobe-seizure edema versus low-grade tumor versus HIV encephalitis. Pt also with suicidal thoughts and had psychiatry evaluation. He left AMA on 5/30 and returned with seizures on 5/31. PMH includes HTN, alcohol abuse, drug abuse., newly diagnosed HIV  Clinical Impression  Patient appears to be at baseline level of functioning. He had no loss of balance. He was independent with all transfers. He did not need to use the cane today. He has no need for further skilled PT.    Follow Up Recommendations No PT follow up    Equipment Recommendations       Recommendations for Other Services       Precautions / Restrictions Precautions Precautions: Fall Restrictions Weight Bearing Restrictions: No      Mobility  Bed Mobility Overal bed mobility: Independent             General bed mobility comments: no assist required   Transfers Overall transfer level: Independent                  Ambulation/Gait Ambulation/Gait assistance: Independent Gait Distance (Feet): 100 Feet     Gait velocity: normal   General Gait Details: no gaurd required. Patient ambualted without device. Was able to turn his head and look up and down.   Stairs            Wheelchair Mobility    Modified Rankin (Stroke Patients Only)       Balance Overall balance assessment: Independent                                           Pertinent Vitals/Pain Pain Assessment: No/denies pain    Home Living Family/patient expects to be discharged to:: Private residence Living Arrangements: Other (Comment) Available Help at Discharge: Available 24  hours/day;Family Type of Home: House Home Access: Stairs to enter Entrance Stairs-Rails: Can reach both Entrance Stairs-Number of Steps: 3-4 Home Layout: One level        Prior Function Level of Independence: Independent         Comments: had been using a cane at the hosptial     Hand Dominance   Dominant Hand: Right    Extremity/Trunk Assessment   Upper Extremity Assessment Upper Extremity Assessment: Overall WFL for tasks assessed    Lower Extremity Assessment Lower Extremity Assessment: Overall WFL for tasks assessed    Cervical / Trunk Assessment Cervical / Trunk Assessment: Normal  Communication   Communication: No difficulties  Cognition Arousal/Alertness: Awake/alert Behavior During Therapy: WFL for tasks assessed/performed Overall Cognitive Status: Within Functional Limits for tasks assessed Area of Impairment: Attention;Following commands;Safety/judgement;Awareness;Problem solving                   Current Attention Level: Sustained   Following Commands: Follows multi-step commands consistently   Awareness: Anticipatory Problem Solving: Difficulty sequencing;Requires verbal cues General Comments: No significant limitations today. Improved safety awareness.       General Comments General comments (skin integrity, edema, etc.): refused to wear socks     Exercises     Assessment/Plan    PT Assessment Patent does not  need any further PT services  PT Problem List         PT Treatment Interventions      PT Goals (Current goals can be found in the Care Plan section)  Acute Rehab PT Goals Patient Stated Goal: to go home  PT Goal Formulation: With patient Time For Goal Achievement: 01/12/19 Potential to Achieve Goals: Good    Frequency     Barriers to discharge        Co-evaluation               AM-PAC PT "6 Clicks" Mobility  Outcome Measure Help needed turning from your back to your side while in a flat bed without using  bedrails?: None Help needed moving from lying on your back to sitting on the side of a flat bed without using bedrails?: None Help needed moving to and from a bed to a chair (including a wheelchair)?: None Help needed standing up from a chair using your arms (e.g., wheelchair or bedside chair)?: None Help needed to walk in hospital room?: None Help needed climbing 3-5 steps with a railing? : None 6 Click Score: 24    End of Session Equipment Utilized During Treatment: Gait belt Activity Tolerance: Patient tolerated treatment well Patient left: in bed;with call bell/phone within reach Nurse Communication: Mobility status PT Visit Diagnosis: Other abnormalities of gait and mobility (R26.89)    Time: 1350-1400 PT Time Calculation (min) (ACUTE ONLY): 10 min   Charges:   PT Evaluation $PT Eval Low Complexity: 1 Low            Dessie Coma PT DPT  01/07/2019, 2:16 PM

## 2019-01-07 NOTE — Progress Notes (Addendum)
NEUROLOGY PROGRESS NOTE  Subjective: Patient states that he feels there is some twitching but is much better.  He is alert and oriented.  No further complaints  Exam: Vitals:   01/06/19 2309 01/07/19 0843  BP: 119/87 114/60  Pulse: (!) 55 (!) 57  Resp: 12 20  Temp:  97.6 F (36.4 C)  SpO2: 100% 100%    Physical Exam   HEENT-  Normocephalic, no lesions, without obvious abnormality.  Normal external eye and conjunctiva.   Abdomen- All 4 quadrants palpated and nontender Extremities- Warm, dry and intact Musculoskeletal-leg atrophy Skin-warm and dry, no hyperpigmentation, vitiligo, or suspicious lesions    Neuro:  Mental Status: Alert, oriented, thought content appropriate.  Speech fluent without evidence of aphasia.  Able to follow 3 step commands without difficulty. Cranial Nerves: II:  Visual fields grossly normal,  III,IV, VI: ptosis not present, extra-ocular motions intact bilaterally pupils equal, round, reactive to light and accommodation V,VII: smile symmetric, facial light touch sensation normal bilaterally VIII: hearing normal bilaterally IX,X: Palate rises midline XI: bilateral shoulder shrug XII: midline tongue extension Motor: Right : Upper extremity   5/5    Left:     Upper extremity   5/5  Lower extremity   5/5     Lower extremity   4+/5 Tone and bulk:normal tone throughout; no atrophy noted Sensory: Pinprick and light touch intact throughout, bilaterally Deep Tendon Reflexes: 2+ and symmetric throughout Plantars: Right: downgoing   Left: downgoing Cerebellar: normal finger-to-nose,     Medications:  Scheduled: . divalproex  500 mg Oral Q12H  . folic acid  1 mg Oral Daily  . levETIRAcetam  1,000 mg Oral BID  . multivitamin with minerals  1 tablet Oral Daily  . thiamine  100 mg Oral Daily   Continuous: . sodium chloride Stopped (01/06/19 2312)    Pertinent Labs/Diagnostics: -248 for AST -174 ALT -CK has trended down to 13, 988 -VPA 55  Mr  Brain Wo Contrast  Result Date: 01/05/2019  IMPRESSION: Mild interval improvement of right parietal cortical signal abnormality which may be related to ongoing seizure activity. No new intracranial abnormality. Electronically Signed   By: Sebastian AcheAllen  Grady M.D.   On: 01/05/2019 15:18     Felicie MornDavid Smith PA-C [facility] [phone]  01/07/2019, 10:31 AM  NEUROHOSPITALIST ADDENDUM Performed a face to face diagnostic evaluation.   I have reviewed the contents of history and physical exam as documented by PA/ARNP/Resident and agree with above documentation.  I have discussed and formulated the above plan as documented. Edits to the note have been made as needed.   Assessment and plan  23 year old male with history of alcohol, marijuana abuse, new diagnosis of HIV who presented in focal status epilepticus with continuous left leg jerking.  Patient left AMA during last admission, however continued to have jerking despite being compliant on Keppra.  CK was also elevated on arrival likely due to rhabdomyolysis from continuous left lower extremity jerking.  Patient was treated with valproic acid and has had no further seizures.  Level today was 55.  He is tolerating it well along with Keppra.  Left lower extremity strength is improved.    CK has been trending downwards.  MRI brain was repeated showed stable/slightly improving signal change in the right parietal lobe.  Significance of MRI findings unclear, may represent seizure.  CSF has been negative (normal cell count, glucose and protein).  JCV from CSF is still pending.  Focal status epilepticus: Resolved Todd'paresis: resolving Rhabdomyolysis:  improving New diagnosis of HIV   Recommendations Continue Depakote 500 mg twice daily and Keppra 1 g twice daily Seizure precautions Alcohol/THC abstinence F/u with ID and Neurology outpatient, explained to patient that it is very important to follow-up Counseled patient on no driving x 6  months  Tylenol for pain  Continue thiamine tabs  F/U PT/OT recs  Per [facility] statutes, patients with seizures are not allowed to drive until they have been seizure-free for six months. Use caution when using heavy equipment or power tools. Avoid working on ladders or at heights. Take showers instead of baths. Ensure the water temperature is not too high on the home water heater. Do not go swimming alone. Do not lock yourself in a room alone (i.e. bathroom). When caring for infants or small children, sit down when holding, feeding, or changing them to minimize risk of injury to the child in the event you have a seizure. Maintain good sleep hygiene. Avoid alcohol.    If Ila Mathieu has another seizure, call 911 and bring them back to the ED if:       A.  The seizure lasts longer than 5 minutes.            B.  The patient doesn't wake shortly after the seizure or has new problems such as difficulty seeing, speaking or moving following the seizure       C.  The patient was injured during the seizure       D.  The patient has a temperature over 102 F (39C)       E.  The patient vomited during the seizure and now is having trouble breathing  Patient can be discharged from neurology standpoint   Georgiana Spinner Aroor MD [facility] [phone]   If 7pm to 7am, please call on call as listed on AMION.

## 2019-01-07 NOTE — TOC Transition Note (Signed)
Transition of Care [facility]) - CM/SW Discharge Note   Patient Details  Name: Jeremy Lane MRN: [id number] Date of Birth: August 01, 1996  Transition of [facility]) CM/SW Contact:  Kermit Balo, RN Phone Number: 01/07/2019, 4:57 PM   Clinical Narrative:    Pt discharging home with self care. Pt's prescriptions faxed to [facility] for discount. Pt has transportation home.   Final next level of care: Home/Self Care Barriers to Discharge: Inadequate or no insurance   Patient Goals and CMS Choice        Discharge Placement                       Discharge Plan and Services                                     Social Determinants of Health (SDOH) Interventions     Readmission Risk Interventions No flowsheet data found.

## 2019-01-07 NOTE — Progress Notes (Signed)
Patient has no IV, discharge paper work given to patient

## 2019-01-07 NOTE — Discharge Instructions (Signed)
Please drink plenty of fluids.

## 2019-01-08 LAB — HLA B*5701: HLA B 5701: NEGATIVE

## 2019-01-08 LAB — JC VIRUS, PCR CSF: JC Virus PCR, CSF: NEGATIVE

## 2019-01-08 MED FILL — DIVALPROEX SOD DR 500 MG TA: 500 | 30 days supply | Qty: 60 | Fill #0

## 2019-01-12 ENCOUNTER — Ambulatory Visit: Payer: Self-pay | Admitting: Family Medicine

## 2019-01-15 ENCOUNTER — Other Ambulatory Visit: Payer: Self-pay

## 2019-01-15 ENCOUNTER — Encounter: Payer: Self-pay | Admitting: Infectious Diseases

## 2019-01-15 ENCOUNTER — Ambulatory Visit (INDEPENDENT_AMBULATORY_CARE_PROVIDER_SITE_OTHER): Payer: Self-pay | Admitting: Infectious Diseases

## 2019-01-15 ENCOUNTER — Ambulatory Visit (INDEPENDENT_AMBULATORY_CARE_PROVIDER_SITE_OTHER): Payer: Self-pay | Admitting: Pharmacist

## 2019-01-15 VITALS — BP 114/76 | HR 80 | Temp 98.0°F | Ht 64.0 in | Wt 116.0 lb

## 2019-01-15 DIAGNOSIS — B20 Human immunodeficiency virus [HIV] disease: Secondary | ICD-10-CM

## 2019-01-15 DIAGNOSIS — T796XXD Traumatic ischemia of muscle, subsequent encounter: Secondary | ICD-10-CM

## 2019-01-15 DIAGNOSIS — Z23 Encounter for immunization: Secondary | ICD-10-CM

## 2019-01-15 MED ORDER — BICTEGRAVIR-EMTRICITAB-TENOFOV 50-200-25 MG PO TABS: 1.0000 | ORAL_TABLET | Freq: Every day | ORAL | 3 refills | Status: DC

## 2019-01-15 NOTE — Progress Notes (Signed)
   Subjective:    Patient ID: Jeremy Lane, male    DOB: 05-09-1996, 23 y.o.   MRN: [id number]  HPI 23 yo M with hx of admission 5-23 to 6-3, newly dx with AIDS, seizure. His CSF was (-). He had LP JC (-). EBV was 189 copies.  He was started on anti-epilietic.  He was not started on ART due to gaps in insurance.  His labs were notable for elevated CK (from previous seizure?).  He is currently taking depakote and keppra.  No further seizures. Has neuro f/u 6-17.  Feels well today.  Here with partner.  No hx of STIs  CD4 T Cell Abs (/uL)  Date Value  01/01/2019 304 (L)   The past medical history, family history and social history were reviewed/updated in EPIC  No sick exposures, has been home quarantining.  Review of Systems  Constitutional: Negative for appetite change, chills, fever and unexpected weight change.  Gastrointestinal: Negative for constipation and diarrhea.  Genitourinary: Negative for difficulty urinating.  Neurological: Positive for headaches. Negative for seizures.  Psychiatric/Behavioral: Negative for dysphoric mood and sleep disturbance.  mood is up and down.  Does not want to talk about other support systems.  Please see HPI. All other systems reviewed and negative.      Objective:   Physical Exam Constitutional:      Appearance: Normal appearance.  HENT:     Mouth/Throat:     Mouth: Mucous membranes are moist.     Pharynx: No oropharyngeal exudate.  Eyes:     Extraocular Movements: Extraocular movements intact.     Pupils: Pupils are equal, round, and reactive to light.  Neck:     Musculoskeletal: Normal range of motion and neck supple.  Cardiovascular:     Rate and Rhythm: Normal rate and regular rhythm.     Heart sounds: Normal heart sounds. No murmur.  Pulmonary:     Effort: Pulmonary effort is normal.     Breath sounds: Normal breath sounds.  Abdominal:     General: Bowel sounds are normal. There is no distension.     Palpations: Abdomen  is soft.     Tenderness: There is no abdominal tenderness.  Musculoskeletal:     Right lower leg: No edema.     Left lower leg: No edema.  Neurological:     General: No focal deficit present.     Mental Status: He is alert and oriented to person, place, and time.  Psychiatric:        Mood and Affect: Mood normal.        Assessment & Plan:

## 2019-01-15 NOTE — Assessment & Plan Note (Signed)
Will recheck his labs today.  

## 2019-01-15 NOTE — Assessment & Plan Note (Addendum)
Needs HPV today. Also PCV13, Flu and Hep A/B Given condoms Partner testing Start biktarvy after meeting with pharm and getting ADAP.  Counseled on ADR.  rtc in 4-6 weeks, pharm f/u in the intervening period.

## 2019-01-15 NOTE — Addendum Note (Signed)
Addended by: Eugenia Mcalpine on: 01/15/2019 04:13 PM   Modules accepted: Orders

## 2019-01-15 NOTE — Progress Notes (Signed)
HPI: Jeremy Lane is a 23 y.o. male who presents to the [facility] clinic today to initiate care with Dr. Johnnye Sima for his newly diagnosed HIV infection.  Patient Active Problem List   Diagnosis Date Noted  . Rhabdomyolysis 01/05/2019  . HIV disease ([place]) 01/02/2019  . 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 Seizure ([place]) 12/27/2018  . Essential hypertension 12/27/2018  . Elevated LFTs 12/27/2018  . Polysubstance abuse ([place]) 12/27/2018    Patient's Medications  New Prescriptions   BICTEGRAVIR-EMTRICITABINE-TENOFOVIR AF (BIKTARVY) 50-200-25 MG TABS TABLET    Take 1 tablet by mouth daily.  Previous Medications   DIVALPROEX (DEPAKOTE) 500 MG DR TABLET    Take 1 tablet (500 mg total) by mouth every 12 (twelve) hours.   FOLIC ACID (FOLVITE) 1 MG TABLET    Take 1 tablet (1 mg total) by mouth daily.   LEVETIRACETAM (KEPPRA) 1000 MG TABLET    Take 1 tablet (1,000 mg total) by mouth 2 (two) times daily.   MULTIPLE VITAMIN (MULTIVITAMIN WITH MINERALS) TABS TABLET    Take 1 tablet by mouth daily.   THIAMINE 100 MG TABLET    Take 1 tablet (100 mg total) by mouth daily.  Modified Medications   No medications on file  Discontinued Medications   No medications on file    Allergies: Allergies  Allergen Reactions  . Fosphenytoin Itching    Past Medical History: Past Medical History:  Diagnosis Date  . HIV (human immunodeficiency virus infection) ([place]) 12/27/2018  . Hypertension   . Seizures ([place])     Social History: Social History   Socioeconomic History  . Marital status: Single    Spouse name: Not on file  . Number of children: Not on file  . Years of education: Not on file  . Highest education level: Not on file  Occupational History  . Not on file  Social Needs  . Financial resource strain: Not on file  . Food insecurity    Worry:  Not on file    Inability: Not on file  . Transportation needs    Medical: Not on file    Non-medical: Not on file  Tobacco Use  . Smoking status: Current Every Day Smoker    Packs/day: 0.20    Types: Cigarettes  . Smokeless tobacco: Never Used  Substance and Sexual Activity  . Alcohol use: Not Currently    Comment: states he has been drinking more lately because he is stressed  . Drug use: Yes    Types: Marijuana, Cocaine    Comment: cocaine "once in a blue moon"  . Sexual activity: Yes    Partners: Male    Comment: refused condoms  Lifestyle  . Physical activity    Days per week: Not on file    Minutes per session: Not on file  . Stress: Not on file  Relationships  . Social Herbalist on phone: Not on file    Gets together: Not on file    Attends religious service: Not on file    Active member of club or organization: Not on file    Attends meetings of clubs or organizations: Not on file    Relationship status: Not on file  Other Topics Concern  . Not on file  Social History Narrative  . Not on file    Labs: Lab Results  Component Value Date   CD4TABS 304 (L) 01/01/2019    RPR and STI Lab Results  Component Value Date   LABRPR Non Reactive 01/01/2019   LABRPR Non Reactive 12/27/2018    No flowsheet data found.  Hepatitis B Lab Results  Component Value Date   HEPBSAG Negative 12/27/2018   Hepatitis C No results found for: HEPCAB, HCVRNAPCRQN Hepatitis A No results found for: HAV Lipids: Lab Results  Component Value Date   CHOL 160 01/01/2019   TRIG 61 01/01/2019   HDL 32 (L) 01/01/2019   CHOLHDL 5.0 01/01/2019   VLDL 12 01/01/2019   LDLCALC 116 (H) 01/01/2019    Current HIV Regimen: Treatment naive  Assessment: Jeremy Lane is here today to initiate care with Dr. Ninetta LightsHatcher for his newly diagnosed HIV infection.  He is treatment naive, but we do not have any baseline labs on him today, just a positive HIV antibody.  Will start patient on  Biktarvy.  Explained that Jeremy Lane is a one pill once daily medication with or without food and the importance of not missing any doses. Explained resistance and how it develops and why it is so important to take Biktarvy daily and not skip days or doses. Counseled patient to take it around the same time each day. Counseled on what to do if dose is missed, if closer to missed dose take immediately, if closer to next dose then skip and resume normal schedule.   Cautioned on possible side effects the first week or so including nausea, diarrhea, dizziness, and headaches but that they should resolve after the first couple of weeks. I reviewed patient medications and found no drug interactions. Counseled patient to separate Biktarvy from divalent cations including multivitamins. Discussed with patient to call clinic if he starts a new medication or herbal supplement. I gave the patient my card and told him to call me with any issues/questions/concerns.  He is ADAP approved so he will get his medication from [facility].  Will have patient follow-up with me in 4-6 weeks for adherence counseling and repeat labs.  His partner is here with him today, so I will start him on PrEP.  Plan: - Start Biktarvy PO once daily - F/u with me 7/22 at 315pm  Jeremy Lane, PharmD, BCIDP, AAHIVP, CPP Infectious Diseases Clinical Pharmacist [facility] 01/16/2019, 11:07 AM

## 2019-01-15 NOTE — Progress Notes (Addendum)
Patient received #1 HPV vaccine today. Tolerated well. Per Dr. Johnnye Sima patient to receive #2 HPV in 1 month and HPV #3 in 47months. Eugenia Mcalpine, LPN

## 2019-01-19 ENCOUNTER — Telehealth: Payer: Self-pay | Admitting: *Deleted

## 2019-01-19 NOTE — Telephone Encounter (Signed)
CK = 17,496.  Per Dr Johnnye Sima, "will continue to watch Was elevated in hospital He is asx for myositis " Landis Gandy, RN

## 2019-01-20 ENCOUNTER — Telehealth: Payer: Self-pay

## 2019-01-20 NOTE — Telephone Encounter (Signed)
Called to do COVID Screening for appointment tomorrow. No answer. Left a message to call back. Thanks! 

## 2019-01-21 ENCOUNTER — Ambulatory Visit: Payer: Self-pay | Admitting: Family Medicine

## 2019-01-28 LAB — HIV-1 INTEGRASE GENOTYPE

## 2019-01-28 LAB — COMPREHENSIVE METABOLIC PANEL
AG Ratio: 1 (calc) (ref 1.0–2.5)
ALT: 180 U/L — ABNORMAL HIGH (ref 9–46)
AST: 214 U/L — ABNORMAL HIGH (ref 10–40)
Albumin: 3.9 g/dL (ref 3.6–5.1)
Alkaline phosphatase (APISO): 42 U/L (ref 36–130)
BUN/Creatinine Ratio: 20 (calc) (ref 6–22)
BUN: 11 mg/dL (ref 7–25)
CO2: 28 mmol/L (ref 20–32)
Calcium: 9.4 mg/dL (ref 8.6–10.3)
Chloride: 104 mmol/L (ref 98–110)
Creat: 0.54 mg/dL — ABNORMAL LOW (ref 0.60–1.35)
Globulin: 3.8 g/dL (calc) — ABNORMAL HIGH (ref 1.9–3.7)
Glucose, Bld: 86 mg/dL (ref 65–99)
Potassium: 3.8 mmol/L (ref 3.5–5.3)
Sodium: 140 mmol/L (ref 135–146)
Total Bilirubin: 0.2 mg/dL (ref 0.2–1.2)
Total Protein: 7.7 g/dL (ref 6.1–8.1)

## 2019-01-28 LAB — QUANTIFERON-TB GOLD PLUS
Mitogen-NIL: 7.09 IU/mL
NIL: 0.02 IU/mL
QuantiFERON-TB Gold Plus: NEGATIVE
TB1-NIL: 0.01 IU/mL
TB2-NIL: 0 IU/mL

## 2019-01-28 LAB — CK: Total CK: 17496 U/L — ABNORMAL HIGH (ref 44–196)

## 2019-01-28 LAB — HIV-1 RNA ULTRAQUANT REFLEX TO GENTYP+
HIV 1 RNA Quant: 76600 copies/mL — ABNORMAL HIGH
HIV-1 RNA Quant, Log: 4.88 Log copies/mL — ABNORMAL HIGH

## 2019-01-28 LAB — HIV-1 GENOTYPE: HIV-1 Genotype: DETECTED — AB

## 2019-02-03 ENCOUNTER — Other Ambulatory Visit: Payer: Self-pay | Admitting: *Deleted

## 2019-02-03 DIAGNOSIS — B20 Human immunodeficiency virus [HIV] disease: Secondary | ICD-10-CM

## 2019-02-03 MED ORDER — BICTEGRAVIR-EMTRICITAB-TENOFOV 50-200-25 MG PO TABS: 1.0000 | ORAL_TABLET | Freq: Every day | ORAL | 5 refills | Status: DC

## 2019-02-05 ENCOUNTER — Encounter: Payer: Self-pay | Admitting: Infectious Disease

## 2019-02-11 LAB — HIV-1 RNA, PCR (GRAPH) RFX/GENO EDI
HIV-1 RNA BY PCR: 32500 copies/mL
HIV-1 RNA Quant, Log: 4.512 log10copy/mL

## 2019-02-11 LAB — HIV GENOSURE(R) MG

## 2019-02-11 LAB — REFLEX TO GENOSURE(R) MG EDI: HIV GenoSure(R): 1

## 2019-02-12 ENCOUNTER — Ambulatory Visit: Payer: Self-pay | Admitting: Family Medicine

## 2019-02-16 NOTE — Progress Notes (Signed)
Ah yes, thank you very much!

## 2019-02-25 ENCOUNTER — Ambulatory Visit: Payer: Self-pay | Admitting: Pharmacist

## 2019-04-29 ENCOUNTER — Ambulatory Visit: Payer: Self-pay | Admitting: Infectious Diseases

## 2019-05-04 ENCOUNTER — Other Ambulatory Visit: Payer: Self-pay

## 2019-05-04 ENCOUNTER — Encounter: Payer: Self-pay | Admitting: Internal Medicine

## 2019-05-04 ENCOUNTER — Ambulatory Visit (INDEPENDENT_AMBULATORY_CARE_PROVIDER_SITE_OTHER): Payer: Self-pay | Admitting: Internal Medicine

## 2019-05-04 VITALS — BP 130/81 | HR 66

## 2019-05-04 DIAGNOSIS — R569 Unspecified convulsions: Secondary | ICD-10-CM

## 2019-05-04 DIAGNOSIS — Z23 Encounter for immunization: Secondary | ICD-10-CM

## 2019-05-04 DIAGNOSIS — B20 Human immunodeficiency virus [HIV] disease: Secondary | ICD-10-CM

## 2019-05-04 MED ORDER — LEVETIRACETAM 500 MG PO TABS: 500.0000 mg | ORAL_TABLET | Freq: Two times a day (BID) | ORAL | 11 refills | Status: AC

## 2019-05-04 MED ORDER — SYMTUZA 800-150-200-10 MG PO TABS: 1.0000 | ORAL_TABLET | Freq: Every day | ORAL | 11 refills | Status: AC

## 2019-05-04 NOTE — Progress Notes (Signed)
RFV: hiv disease  Patient ID: Jeremy Lane, male   DOB: 05-21-1996, 23 y.o.   MRN: [id number]  HPI Tavon "Ulice Dash"  is a 23 yo TGF with HIV disease with seizure disorder, CD 4 count  was supposed to start biktarvy but hasn't. In fact, she is out of All her Anti epileptic medication. Not had another seizure since June. Known to happen with ETOH, or stress, she reports   Last tested negative November 2019 - had 3 partners until she was diagnosed  She has been together with current partner - husband, who is on prep. Not having sex, mostly foreplay.   Outpatient Encounter Medications as of 05/04/2019  Medication Sig  . bictegravir-emtricitabine-tenofovir AF (BIKTARVY) 50-200-25 MG TABS tablet Take 1 tablet by mouth daily.  . divalproex (DEPAKOTE) 500 MG DR tablet Take 1 tablet (500 mg total) by mouth every 12 (twelve) hours.  . folic acid (FOLVITE) 1 MG tablet Take 1 tablet (1 mg total) by mouth daily.  Marland Kitchen levETIRAcetam (KEPPRA) 1000 MG tablet Take 1 tablet (1,000 mg total) by mouth 2 (two) times daily.  . Multiple Vitamin (MULTIVITAMIN WITH MINERALS) TABS tablet Take 1 tablet by mouth daily.  Marland Kitchen thiamine 100 MG tablet Take 1 tablet (100 mg total) by mouth daily.   No facility-administered encounter medications on file as of 05/04/2019.      Patient Active Problem List   Diagnosis Date Noted  . Rhabdomyolysis 01/05/2019  . HIV disease ([place]) 01/02/2019  . 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 Seizure ([place]) 12/27/2018  . Essential hypertension 12/27/2018  . Elevated LFTs 12/27/2018  . Polysubstance abuse ([place]) 12/27/2018     Health Maintenance Due  Topic Date Due  . TETANUS/TDAP  12/17/2014  . INFLUENZA VACCINE  03/07/2019   family history includes Diabetes in his father and mother; Hypertension in his father and mother. Soc hx: 1 or  twice a month - drink 40 oz x 1. Smoking 1 cig per day. Smoke MJ daily Review of Systems Review of Systems  Constitutional: Negative for fever, chills, diaphoresis, activity change, appetite change, fatigue and unexpected weight change.  HENT: Negative for congestion, sore throat, rhinorrhea, sneezing, trouble swallowing and sinus pressure.  Eyes: Negative for photophobia and visual disturbance.  Respiratory: Negative for cough, chest tightness, shortness of breath, wheezing and stridor.  Cardiovascular: Negative for chest pain, palpitations and leg swelling.  Gastrointestinal: Negative for nausea, vomiting, abdominal pain, diarrhea, constipation, blood in stool, abdominal distention and anal bleeding.  Genitourinary: Negative for dysuria, hematuria, flank pain and difficulty urinating.  Musculoskeletal: Negative for myalgias, back pain, joint swelling, arthralgias and gait problem.  Skin: Negative for color change, pallor, rash and wound.  Neurological: Negative for dizziness, tremors, weakness and light-headedness.  Hematological: Negative for adenopathy. Does not bruise/bleed easily.  Psychiatric/Behavioral: Negative for behavioral problems, confusion, sleep disturbance, dysphoric mood, decreased concentration and agitation.    Physical Exam  BP 130/81   Pulse 66  Physical Exam  Constitutional:  oriented to person, place, and time. appears well-developed and well-nourished. No distress.  HENT: [place]/AT, PERRLA, no scleral icterus Mouth/Throat: Oropharynx is clear and moist. No oropharyngeal exudate.  Cardiovascular: Normal rate, regular rhythm and normal heart sounds. Exam reveals no gallop and no friction rub.  No murmur heard.  Pulmonary/Chest: Effort normal and breath sounds normal. No respiratory distress.  has no wheezes.  Neck = supple, no nuchal rigidity Abdominal: Soft. Bowel sounds are normal.  exhibits no distension. There is no tenderness.  Lymphadenopathy: no cervical  adenopathy. No axillary adenopathy Neurological: alert and oriented to person, place, and time.  Skin: Skin is warm and dry. No rash noted. No erythema.  Psychiatric: a normal mood and affect.  behavior is normal.      Lab Results  Component Value Date   CD4TCELL 16 (L) 01/01/2019   Lab Results  Component Value Date   CD4TABS 304 (L) 01/01/2019   Lab Results  Component Value Date   HIV1RNAQUANT 76,600 (H) 01/15/2019   No results found for: HEPBSAB Lab Results  Component Value Date   LABRPR Non Reactive 01/01/2019    CBC Lab Results  Component Value Date   WBC 3.7 (L) 01/04/2019   RBC 4.51 01/04/2019   HGB 12.4 (L) 01/04/2019   HCT 37.8 (L) 01/04/2019   PLT 194 01/04/2019   MCV 83.8 01/04/2019   MCH 27.5 01/04/2019   MCHC 32.8 01/04/2019   RDW 12.4 01/04/2019   LYMPHSABS 2.1 12/27/2018   MONOABS 0.3 12/27/2018   EOSABS 0.0 12/27/2018    BMET Lab Results  Component Value Date   NA 140 01/15/2019   K 3.8 01/15/2019   CL 104 01/15/2019   CO2 28 01/15/2019   GLUCOSE 86 01/15/2019   BUN 11 01/15/2019   CREATININE 0.54 (L) 01/15/2019   CALCIUM 9.4 01/15/2019   GFRNONAA >60 01/07/2019   GFRAA >60 01/07/2019      Assessment and Plan  hiv disease = poorly controlled. Had little insight to importance of taking ART. Also reminded her of hte U = U campaign.given concern for missing doses, will start her on symtuza instead of biktarvy. Counseled on importance of medication adherence  Health promotion = will give pneumonia, hpv #2. Defer flu until next visit. Will screen for sti  Seizure = will start on keppra 500mg  bid for seizure with meds refill. Reminded to avoid drugs that cause her to have seizures  TG health = does not appear to be on hormone therapy. Will discuss at next appt   Redo adap today  Spent 45 min with patient with greater than 50% with counseling on hiv medication adherence

## 2019-05-05 LAB — T-HELPER CELL (CD4) - (RCID CLINIC ONLY)
CD4 % Helper T Cell: 23 % — ABNORMAL LOW (ref 33–65)
CD4 T Cell Abs: 421 /uL (ref 400–1790)

## 2019-05-05 LAB — CYTOLOGY, (ORAL, ANAL, URETHRAL) ANCILLARY ONLY
Chlamydia: NEGATIVE
Chlamydia: POSITIVE — AB
Molecular Disclaimer: NEGATIVE
Molecular Disclaimer: NEGATIVE
Molecular Disclaimer: NORMAL
Molecular Disclaimer: NORMAL
Neisseria Gonorrhea: NEGATIVE
Neisseria Gonorrhea: NEGATIVE

## 2019-05-07 LAB — CRYPTOCOCCAL AG, LTX SCR RFLX TITER
Cryptococcal Ag Screen: NOT DETECTED
MICRO NUMBER:: 930990
SPECIMEN QUALITY:: ADEQUATE

## 2019-05-08 LAB — CBC WITH DIFFERENTIAL/PLATELET
Absolute Monocytes: 189 cells/uL — ABNORMAL LOW (ref 200–950)
Basophils Absolute: 10 cells/uL (ref 0–200)
Basophils Relative: 0.3 %
Eosinophils Absolute: 48 cells/uL (ref 15–500)
Eosinophils Relative: 1.5 %
HCT: 39.7 % (ref 38.5–50.0)
Hemoglobin: 12.9 g/dL — ABNORMAL LOW (ref 13.2–17.1)
Lymphs Abs: 1853 cells/uL (ref 850–3900)
MCH: 28.3 pg (ref 27.0–33.0)
MCHC: 32.5 g/dL (ref 32.0–36.0)
MCV: 87.1 fL (ref 80.0–100.0)
MPV: 11.1 fL (ref 7.5–12.5)
Monocytes Relative: 5.9 %
Neutro Abs: 1101 cells/uL — ABNORMAL LOW (ref 1500–7800)
Neutrophils Relative %: 34.4 %
Platelets: 199 10*3/uL (ref 140–400)
RBC: 4.56 10*6/uL (ref 4.20–5.80)
RDW: 13 % (ref 11.0–15.0)
Total Lymphocyte: 57.9 %
WBC: 3.2 10*3/uL — ABNORMAL LOW (ref 3.8–10.8)

## 2019-05-08 LAB — COMPLETE METABOLIC PANEL WITH GFR
AG Ratio: 1 (calc) (ref 1.0–2.5)
ALT: 152 U/L — ABNORMAL HIGH (ref 9–46)
AST: 193 U/L — ABNORMAL HIGH (ref 10–40)
Albumin: 3.8 g/dL (ref 3.6–5.1)
Alkaline phosphatase (APISO): 33 U/L — ABNORMAL LOW (ref 36–130)
BUN/Creatinine Ratio: 24 (calc) — ABNORMAL HIGH (ref 6–22)
BUN: 9 mg/dL (ref 7–25)
CO2: 26 mmol/L (ref 20–32)
Calcium: 9.3 mg/dL (ref 8.6–10.3)
Chloride: 104 mmol/L (ref 98–110)
Creat: 0.37 mg/dL — ABNORMAL LOW (ref 0.60–1.35)
GFR, Est African American: 200 mL/min/{1.73_m2} (ref >=60)
GFR, Est Non African American: 173 mL/min/{1.73_m2} (ref >=60)
Globulin: 4 g/dL (calc) — ABNORMAL HIGH (ref 1.9–3.7)
Glucose, Bld: 110 mg/dL — ABNORMAL HIGH (ref 65–99)
Potassium: 3.8 mmol/L (ref 3.5–5.3)
Sodium: 139 mmol/L (ref 135–146)
Total Bilirubin: 0.4 mg/dL (ref 0.2–1.2)
Total Protein: 7.8 g/dL (ref 6.1–8.1)

## 2019-05-08 LAB — RPR: RPR Ser Ql: NONREACTIVE

## 2019-05-08 LAB — TOXOPLASMA GONDII ANTIBODY, IGG: Toxoplasma IgG Ratio: <7.2 IU/mL

## 2019-05-08 LAB — HIV-1 RNA QUANT-NO REFLEX-BLD
HIV 1 RNA Quant: 34300 copies/mL — ABNORMAL HIGH
HIV-1 RNA Quant, Log: 4.54 Log copies/mL — ABNORMAL HIGH

## 2019-06-11 ENCOUNTER — Ambulatory Visit: Payer: Self-pay | Admitting: Internal Medicine

## 2019-08-12 ENCOUNTER — Telehealth: Payer: Self-pay

## 2019-08-12 ENCOUNTER — Ambulatory Visit: Payer: Self-pay | Admitting: Internal Medicine

## 2019-08-12 NOTE — Telephone Encounter (Signed)
Attempted to call patient due to missed provider visit with Dr. Drue Second. Also noted patient is due to renew RW/ADAP. Routing to financial to see if they may be able to reach him. Valarie Cones

## 2019-08-17 NOTE — Telephone Encounter (Signed)
Tried to contact patient unable to contact. Number unavailable. Thank you

## 2019-12-16 ENCOUNTER — Emergency Department (HOSPITAL_COMMUNITY)
Admission: EM | Admit: 2019-12-16 | Discharge: 2019-12-17 | Disposition: A | Discharge status: Home / Self Care | Payer: Self-pay | Attending: Emergency Medicine | Admitting: Emergency Medicine

## 2019-12-16 ENCOUNTER — Encounter (HOSPITAL_COMMUNITY): Payer: Self-pay

## 2019-12-16 DIAGNOSIS — F17210 Nicotine dependence, cigarettes, uncomplicated: Secondary | ICD-10-CM | POA: Insufficient documentation

## 2019-12-16 DIAGNOSIS — Z79899 Other long term (current) drug therapy: Secondary | ICD-10-CM | POA: Insufficient documentation

## 2019-12-16 DIAGNOSIS — Z8669 Personal history of other diseases of the nervous system and sense organs: Secondary | ICD-10-CM | POA: Insufficient documentation

## 2019-12-16 DIAGNOSIS — M79605 Pain in left leg: Secondary | ICD-10-CM | POA: Insufficient documentation

## 2019-12-16 DIAGNOSIS — B20 Human immunodeficiency virus [HIV] disease: Secondary | ICD-10-CM | POA: Insufficient documentation

## 2019-12-16 DIAGNOSIS — I10 Essential (primary) hypertension: Secondary | ICD-10-CM | POA: Insufficient documentation

## 2019-12-16 LAB — DIFFERENTIAL
Abs Immature Granulocytes: 0 10*3/uL (ref 0.00–0.07)
Basophils Absolute: 0 10*3/uL (ref 0.0–0.1)
Basophils Relative: 0 %
Eosinophils Absolute: 0 10*3/uL (ref 0.0–0.5)
Eosinophils Relative: 1 %
Immature Granulocytes: 0 %
Lymphocytes Relative: 55 %
Lymphs Abs: 1.9 10*3/uL (ref 0.7–4.0)
Monocytes Absolute: 0.2 10*3/uL (ref 0.1–1.0)
Monocytes Relative: 6 %
Neutro Abs: 1.3 10*3/uL — ABNORMAL LOW (ref 1.7–7.7)
Neutrophils Relative %: 38 %

## 2019-12-16 LAB — I-STAT CHEM 8, ED
BUN: 11 mg/dL (ref 6–20)
Calcium, Ion: 1.27 mmol/L (ref 1.15–1.40)
Chloride: 103 mmol/L (ref 98–111)
Creatinine, Ser: 0.4 mg/dL — ABNORMAL LOW (ref 0.61–1.24)
Glucose, Bld: 87 mg/dL (ref 70–99)
HCT: 35 % — ABNORMAL LOW (ref 39.0–52.0)
Hemoglobin: 11.9 g/dL — ABNORMAL LOW (ref 13.0–17.0)
Potassium: 3.5 mmol/L (ref 3.5–5.1)
Sodium: 143 mmol/L (ref 135–145)
TCO2: 27 mmol/L (ref 22–32)

## 2019-12-16 LAB — COMPREHENSIVE METABOLIC PANEL
ALT: 240 U/L — ABNORMAL HIGH (ref 0–44)
AST: 308 U/L — ABNORMAL HIGH (ref 15–41)
Albumin: 3.1 g/dL — ABNORMAL LOW (ref 3.5–5.0)
Alkaline Phosphatase: 36 U/L — ABNORMAL LOW (ref 38–126)
Anion gap: 9 (ref 5–15)
BUN: 10 mg/dL (ref 6–20)
CO2: 25 mmol/L (ref 22–32)
Calcium: 8.9 mg/dL (ref 8.9–10.3)
Chloride: 106 mmol/L (ref 98–111)
Creatinine, Ser: 0.41 mg/dL — ABNORMAL LOW (ref 0.61–1.24)
GFR calc Af Amer: >60 mL/min (ref >60)
GFR calc non Af Amer: >60 mL/min (ref >60)
Glucose, Bld: 98 mg/dL (ref 70–99)
Potassium: 4.1 mmol/L (ref 3.5–5.1)
Sodium: 140 mmol/L (ref 135–145)
Total Bilirubin: 0.7 mg/dL (ref 0.3–1.2)
Total Protein: 7.2 g/dL (ref 6.5–8.1)

## 2019-12-16 LAB — CBC
HCT: 34.8 % — ABNORMAL LOW (ref 39.0–52.0)
Hemoglobin: 11 g/dL — ABNORMAL LOW (ref 13.0–17.0)
MCH: 27.4 pg (ref 26.0–34.0)
MCHC: 31.6 g/dL (ref 30.0–36.0)
MCV: 86.6 fL (ref 80.0–100.0)
Platelets: 192 10*3/uL (ref 150–400)
RBC: 4.02 MIL/uL — ABNORMAL LOW (ref 4.22–5.81)
RDW: 13.1 % (ref 11.5–15.5)
WBC: 3.4 10*3/uL — ABNORMAL LOW (ref 4.0–10.5)
nRBC: 0 % (ref 0.0–0.2)

## 2019-12-16 LAB — PROTIME-INR
INR: 1.1 (ref 0.8–1.2)
Prothrombin Time: 13.9 seconds (ref 11.4–15.2)

## 2019-12-16 LAB — APTT: aPTT: 44 seconds — ABNORMAL HIGH (ref 24–36)

## 2019-12-16 MED ORDER — SODIUM CHLORIDE 0.9% FLUSH: 3.0000 mL | Freq: Once | INTRAVENOUS | Status: DC

## 2019-12-16 NOTE — ED Triage Notes (Signed)
Pt reports left leg pain that has been going on and off for a while, denies injury, reports intermittent numbness that comes and goes in the leg as well. 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

## 2019-12-17 NOTE — ED Provider Notes (Signed)
[facility] EMERGENCY DEPARTMENT Provider Note   CSN: [id number] Arrival date & time: 12/16/19  2126     History Chief Complaint  Patient presents with  . Leg Pain    Jeremy Lane is a 24 y.o. male.  24 year old male with past medical history of HIV and seizures presents with complaint of weakness, pain, numbness in the left leg.  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  Reports intermittent numbness to the entire leg, occasionally results in falls.  Review of records, patient was admitted for status epilepticus due to complex partial seizure involving left leg, had MRI and review of records this was negative for CVA.  Patient is noncompliant with her seizure regimen, states unable to afford.  Patient reports compliance with HIV therapy, and had moved out of the area and was getting medications from that provider, has recently relocated to this area, is almost out of medications and will need to reestablish care.  Patient denies fevers, history of IV drug abuse, any other extremity deficits or complaints or concerns.        Past Medical History:  Diagnosis Date  . HIV (human immunodeficiency virus infection) (HCC) 12/27/2018  . Hypertension   . Seizures [facility])     Patient Active Problem List   Diagnosis Date Noted  . Rhabdomyolysis 01/05/2019  . HIV disease (HCC) 01/02/2019  . 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 Seizure (HCC) 12/27/2018  . Essential hypertension 12/27/2018  . Elevated LFTs 12/27/2018  . Polysubstance abuse (HCC) 12/27/2018    History reviewed. No pertinent surgical history.     Family History  Problem Relation Age of Onset  . Diabetes Mother   . Hypertension Mother   .  Diabetes Father   . Hypertension Father     Social History   Tobacco Use  . Smoking status: Current Every Day Smoker    Packs/day: 0.20    Types: Cigarettes  . Smokeless tobacco: Never Used  Substance Use Topics  . Alcohol use: Not Currently    Comment: states he has been drinking more lately because he is stressed  . Drug use: Yes    Types: Marijuana, Cocaine    Comment: cocaine "once in a blue moon"    Home Medications Prior to Admission medications   Medication Sig Start Date End Date Taking? Authorizing Provider  Darunavir-Cobicisctat-Emtricitabine-Tenofovir Alafenamide (SYMTUZA) 800-150-200-10 MG TABS Take 1 tablet by mouth daily with breakfast. 05/04/19   Judyann Munson, MD  levETIRAcetam (KEPPRA) 500 MG tablet Take 1 tablet (500 mg total) by mouth 2 (two) times daily. 05/04/19   Judyann Munson, MD    Allergies    Fosphenytoin  Review of Systems   Review of Systems  Constitutional: Negative for fever.  Respiratory: Negative for shortness of breath.   Cardiovascular: Negative for chest pain.  Gastrointestinal: Negative for abdominal pain.  Musculoskeletal: Positive for arthralgias, gait problem and myalgias. Negative for back pain, joint swelling, neck pain and neck stiffness.  Skin: Negative for color change, rash and wound.  Allergic/Immunologic: Positive for immunocompromised state.  Neurological: Positive for weakness and numbness.  Hematological: Negative for adenopathy.  Psychiatric/Behavioral: Negative for confusion.  All other systems reviewed and are negative.   Physical Exam Updated Vital  Signs BP 121/75   Pulse 87   Temp 98.7 F (37.1 C) (Oral)   Resp (!) 24   SpO2 99%   Physical Exam Vitals and nursing note reviewed.  Constitutional:      General: He is not in acute distress.    Appearance: He is well-developed. He is not diaphoretic.  HENT:     Head: Normocephalic and atraumatic.  Cardiovascular:     Rate and Rhythm: Normal rate and  regular rhythm.     Pulses: Normal pulses.     Heart sounds: Normal heart sounds.  Pulmonary:     Effort: Pulmonary effort is normal.     Breath sounds: Normal breath sounds.  Abdominal:     Palpations: Abdomen is soft.     Tenderness: There is no abdominal tenderness.  Musculoskeletal:        General: Tenderness present. No swelling or deformity.     Right lower leg: No edema.     Left lower leg: No edema.     Comments: Intermittent non specific tenderness of left achilles on exam. No tenderness to posterior left leg, calf is soft and non tender. No tenderness to back. Able to straight leg extend right leg to almost 90 degrees, unable to extend left left to 135 degrees secondary to complaint of pain in posterior left knee along hamstrings.   Skin:    General: Skin is warm and dry.     Findings: No bruising, erythema, lesion or rash.  Neurological:     Mental Status: He is alert and oriented to person, place, and time.  Psychiatric:        Behavior: Behavior normal.     ED Results / Procedures / Treatments   Labs (all labs ordered are listed, but only abnormal results are displayed) Labs Reviewed  APTT - Abnormal; Notable for the following components:      Result Value   aPTT 44 (*)    All other components within normal limits  CBC - Abnormal; Notable for the following components:   WBC 3.4 (*)    RBC 4.02 (*)    Hemoglobin 11.0 (*)    HCT 34.8 (*)    All other components within normal limits  DIFFERENTIAL - Abnormal; Notable for the following components:   Neutro Abs 1.3 (*)    All other components within normal limits  COMPREHENSIVE METABOLIC PANEL - Abnormal; Notable for the following components:   Creatinine, Ser 0.41 (*)    Albumin 3.1 (*)    AST 308 (*)    ALT 240 (*)    Alkaline Phosphatase 36 (*)    All other components within normal limits  I-STAT CHEM 8, ED - Abnormal; Notable for the following components:   Creatinine, Ser 0.40 (*)    Hemoglobin 11.9 (*)      HCT 35.0 (*)    All other components within normal limits  PROTIME-INR  CBG MONITORING, ED    EKG EKG Interpretation  Date/Time:  Wednesday Dec 16 2019 22:10:59 EDT Ventricular Rate:  75 PR Interval:  116 QRS Duration: 102 QT Interval:  368 QTC Calculation: 410 R Axis:   -12 Text Interpretation: Normal sinus rhythm Incomplete right bundle branch block Minimal voltage criteria for LVH, may be normal variant ( R in aVL ) T wave abnormality similar to prior Abnormal ECG Confirmed by Gerhard Munch [phone]) on 12/16/2019 10:22:04 PM   Radiology No results found.  Procedures Procedures (including critical care time)  Medications Ordered  in ED Medications  sodium chloride flush (NS) 0.9 % injection 3 mL (has no administration in time range)    ED Course  I have reviewed the triage vital signs and the nursing notes.  Pertinent labs & imaging results that were available during my care of the patient were reviewed by me and considered in my medical decision making (see chart for details).  Clinical Course as of Dec 17 214  Thu Dec 17, 2019  0212 24yo male with history as documented above. On exam, seems to have pain secondary to tightness of left hamstrings. Record reviewed with Dr. Leonides Schanz, ER attending. Labs revieewed, no changes to CBC, CMP with persistent elevated LFTs (denies alcohol use, states can't drink due to his kidneys), no tylenol use.  Patient has recently moved back to the area, does not recall who he was seeing to manage his HIV, will refer at dc, recommend warm compresses with gentle stretching, advised likely needs PT referral from PCP.    [LM]    Clinical Course User Index [LM] Roque Lias   MDM Rules/Calculators/A&P                      Final Clinical Impression(s) / ED Diagnoses Final diagnoses:  Left leg pain    Rx / DC Orders ED Discharge Orders    None       Tacy Learn, PA-C 12/17/19 0216    Ward, Delice Bison, DO 12/17/19  [phone]

## 2019-12-17 NOTE — Discharge Instructions (Addendum)
Recommend warm compresses with gentle stretching (see discharge instructions for exercises).  Contact infectious disease for local care.

## 2019-12-17 NOTE — ED Notes (Signed)
Sign pad unavailable on discharge. Pt provided with paperwork and verbalizes understanding of instructions. Armband removed. A&ox4, ambulatory.

## 2019-12-27 IMAGING — MR MRI HEAD WITHOUT CONTRAST
9 of 11 series · 36 of 48 positions shown · non-contrast
Comparison: 12/27/2018

CLINICAL DATA: New onset partial seizures with right parietal
cortical signal abnormality on MRI. Persistent leg twitching.

EXAM:
MRI HEAD WITHOUT CONTRAST
TECHNIQUE: Multiplanar, multiecho pulse sequences of the brain and surrounding
structures were obtained without intravenous contrast.

[Series 3: DWI · axial · 3.0mm · 0.94mm/px · z∈[-116,+37]mm · 9 of 110 slices shown (1 of 2)]
[im 1/110]
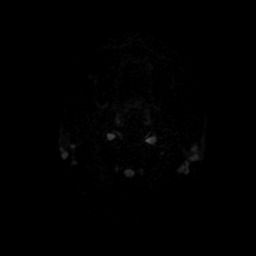
[im 14/110]
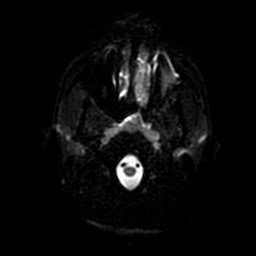
[im 28/110]
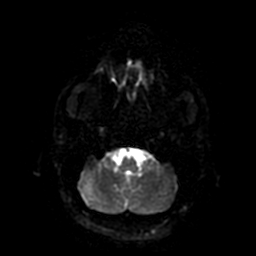
[im 41/110]
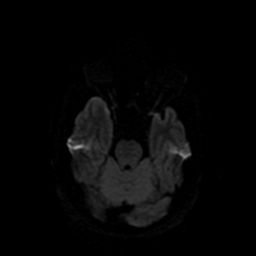
[im 55/110]
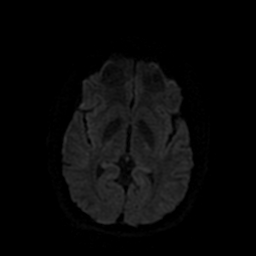
[im 69/110]
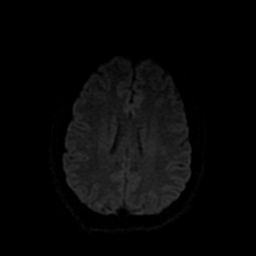
[im 82/110]
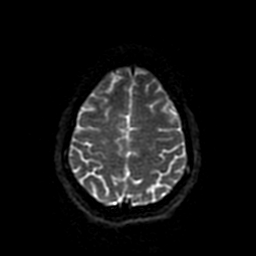
[im 96/110]
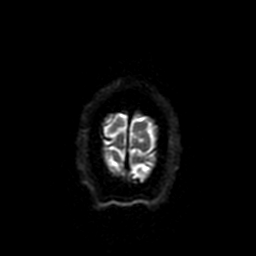
[im 110/110]
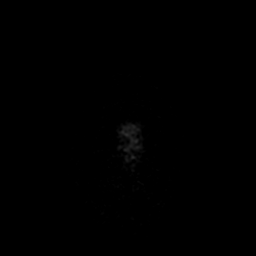

[Series 4: DWI · coronal · 4.0mm · 0.94mm/px · 6 of 76 slices shown (2 of 2)]
[im 1/76]
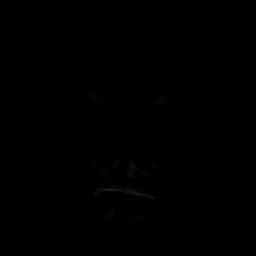
[im 16/76]
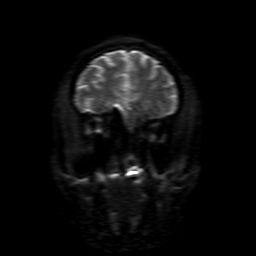
[im 31/76]
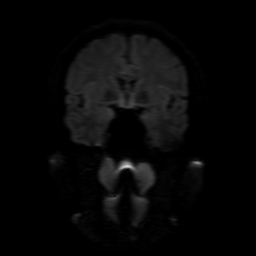
[im 46/76]
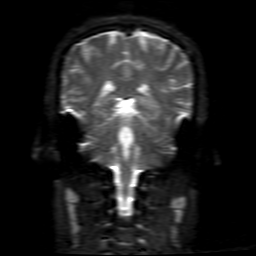
[im 61/76]
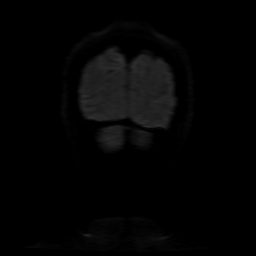
[im 76/76]
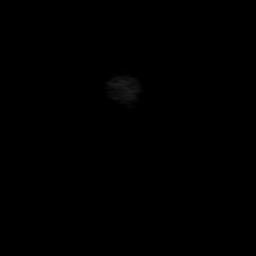

[Series 5: FLAIR · sagittal · 5.0mm · 0.47mm/px · 2 of 25 slices shown (1 of 2)]
[im 1/25]
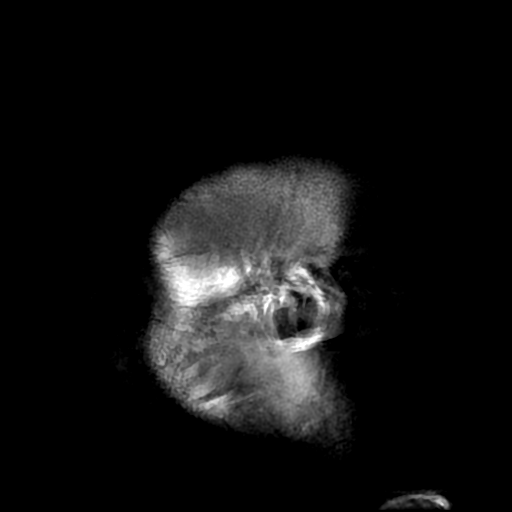
[im 25/25]
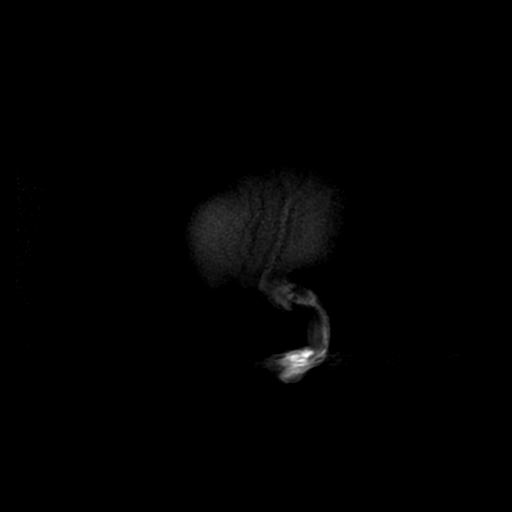

[Series 7: T2 · axial · 5.0mm · 0.47mm/px · z∈[-117,+36]mm · 2 of 28 slices shown (1 of 2)]
[im 1/28]
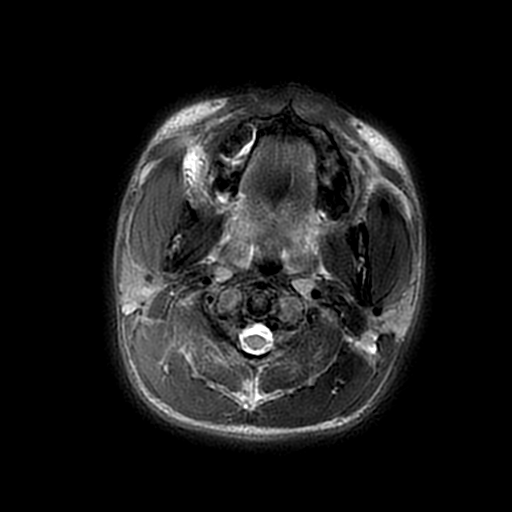
[im 28/28]
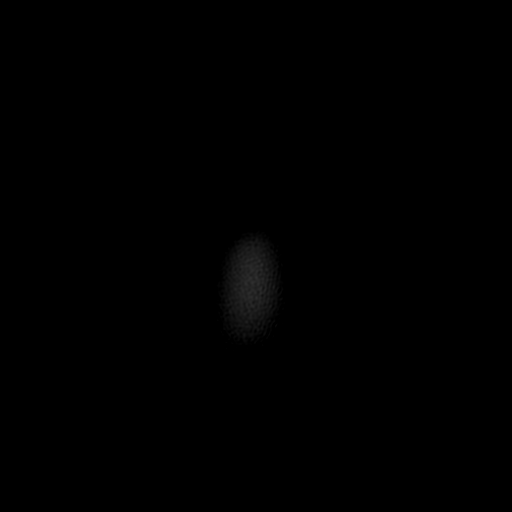

[Series 9: SWI · axial · 3.0mm · 0.47mm/px · z∈[-115,-8]mm · 6 of 108 slices shown]
[im 1/108]
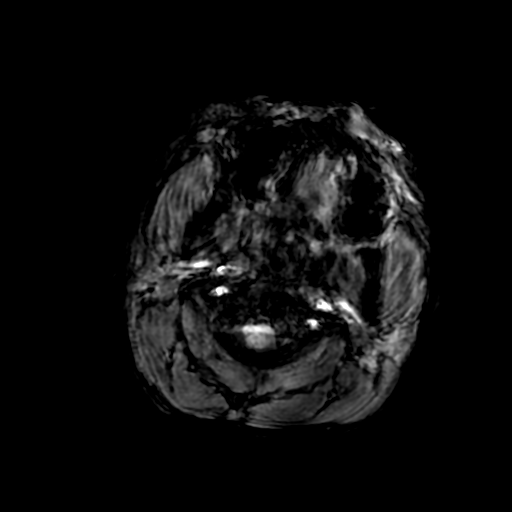
[im 16/108]
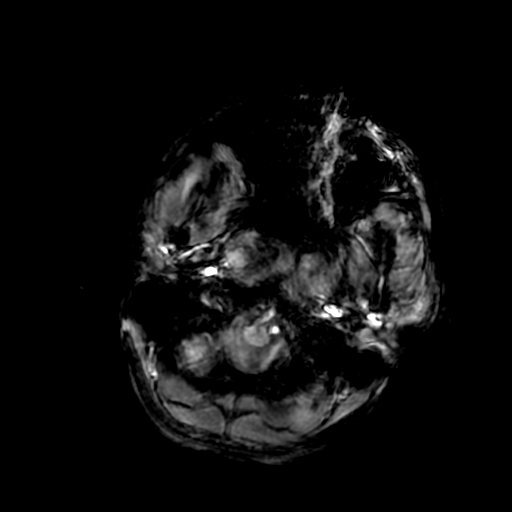
[im 31/108]
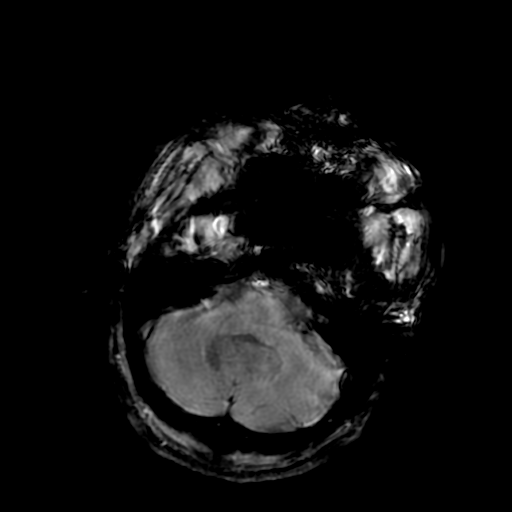
[im 46/108]
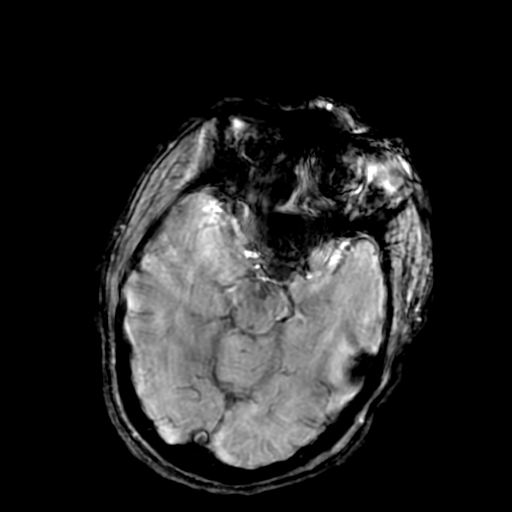
[im 62/108]
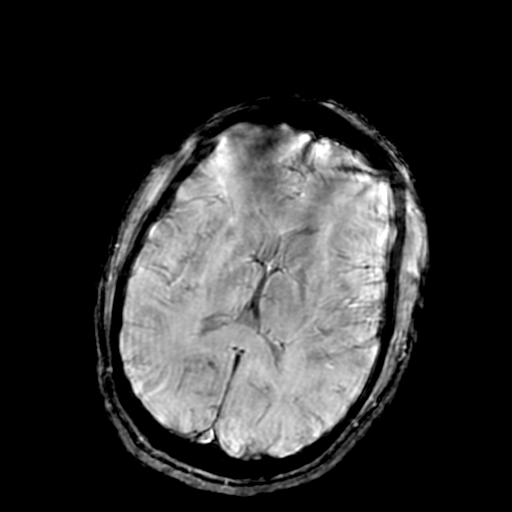
[im 77/108]
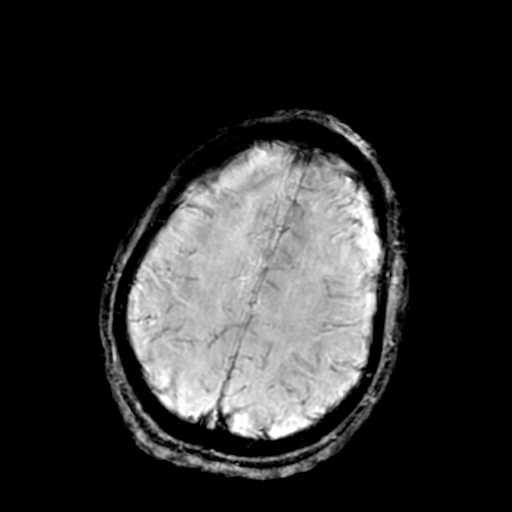

[Series 12: T2 · coronal · 5.0mm · 0.86mm/px · 2 of 32 slices shown (2 of 2)]
[im 1/32]
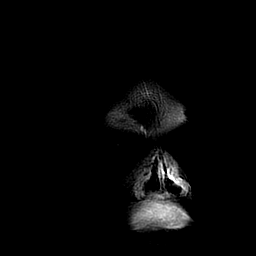
[im 32/32]
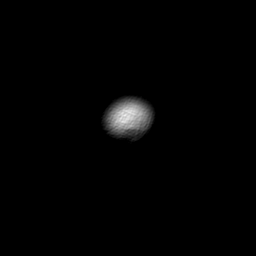

[Series 14: FLAIR · axial · 3.0mm · 0.47mm/px · z∈[-117,+36]mm · 2 of 28 slices shown (2 of 2)]
[im 1/28]
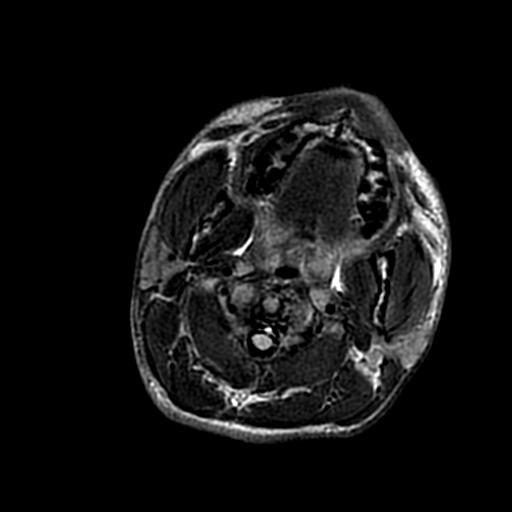
[im 28/28]
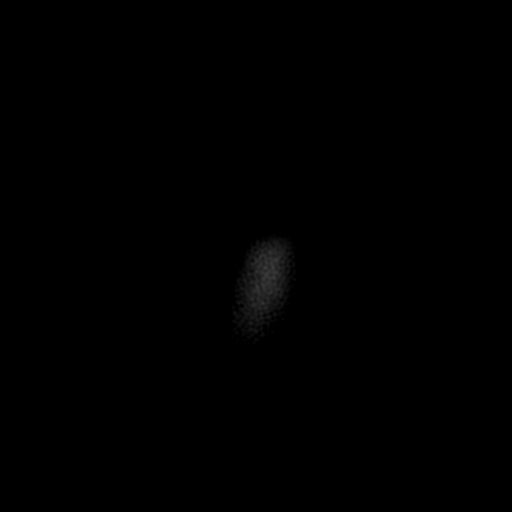

[Series 350: ADC · axial · 3.0mm · 0.94mm/px · z∈[-116,+37]mm · 4 of 55 slices shown (1 of 2)]
[im 1/55]
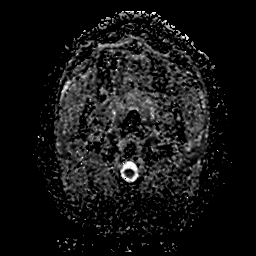
[im 19/55]
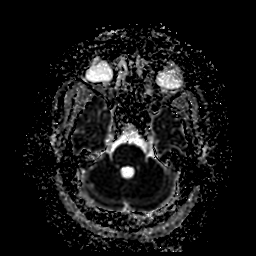
[im 37/55]
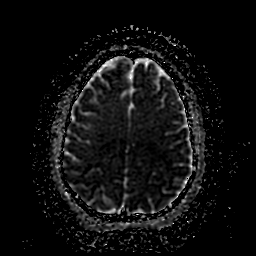
[im 55/55]
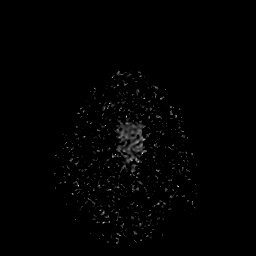

[Series 450: ADC · coronal · 4.0mm · 0.94mm/px · 3 of 38 slices shown (2 of 2)]
[im 1/38]
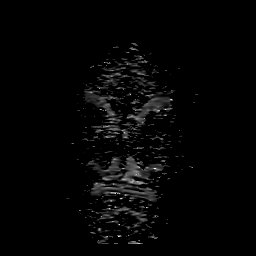
[im 19/38]
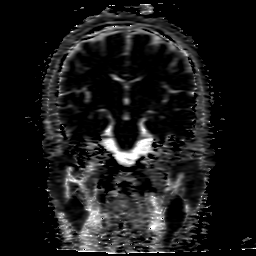
[im 38/38]
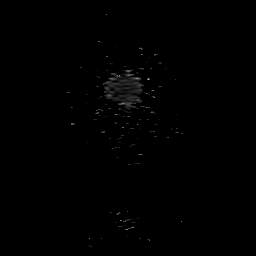

[36 of 48 positions shown; findings below may reference images not displayed]

FINDINGS: The study is intermittently up to moderately motion degraded.

Brain: There is a region of persistent cortical/T2 hyperintensity in
the right parietal lobe which has mildly decreased in extent.
Associated trace diffusion hyperintensity is also decreased without
restricted diffusion. There is no significant gyral enlargement. The
brain is normal in signal elsewhere without evidence of acute
infarct, intracranial hemorrhage, mass effect, or extra-axial fluid
collection. The ventricles and sulci are normal in size.

Vascular: Major intracranial vascular flow voids are preserved.

Skull and upper cervical spine: Unremarkable bone marrow signal.

Sinuses/Orbits: Unremarkable orbits. Mild mucosal thickening in the
paranasal sinuses. Clear mastoid air cells.

Other: Similar appearance of borderline to mildly enlarged lymph
nodes in the upper neck bilaterally.
IMPRESSION: Mild interval improvement of right parietal cortical signal
abnormality which may be related to ongoing seizure activity. No new
intracranial abnormality.

## 2019-12-28 ENCOUNTER — Encounter (HOSPITAL_COMMUNITY): Payer: Self-pay

## 2019-12-28 ENCOUNTER — Other Ambulatory Visit: Payer: Self-pay

## 2019-12-28 DIAGNOSIS — Z9119 Patient's noncompliance with other medical treatment and regimen: Secondary | ICD-10-CM | POA: Insufficient documentation

## 2019-12-28 DIAGNOSIS — B20 Human immunodeficiency virus [HIV] disease: Secondary | ICD-10-CM | POA: Insufficient documentation

## 2019-12-28 DIAGNOSIS — R197 Diarrhea, unspecified: Secondary | ICD-10-CM | POA: Insufficient documentation

## 2019-12-28 DIAGNOSIS — R1033 Periumbilical pain: Secondary | ICD-10-CM | POA: Insufficient documentation

## 2019-12-28 DIAGNOSIS — R112 Nausea with vomiting, unspecified: Secondary | ICD-10-CM | POA: Insufficient documentation

## 2019-12-28 DIAGNOSIS — I10 Essential (primary) hypertension: Secondary | ICD-10-CM | POA: Insufficient documentation

## 2019-12-28 LAB — CBC
HCT: 36 % — ABNORMAL LOW (ref 39.0–52.0)
Hemoglobin: 11.6 g/dL — ABNORMAL LOW (ref 13.0–17.0)
MCH: 27.8 pg (ref 26.0–34.0)
MCHC: 32.2 g/dL (ref 30.0–36.0)
MCV: 86.3 fL (ref 80.0–100.0)
Platelets: 203 10*3/uL (ref 150–400)
RBC: 4.17 MIL/uL — ABNORMAL LOW (ref 4.22–5.81)
RDW: 13.1 % (ref 11.5–15.5)
WBC: 6.6 10*3/uL (ref 4.0–10.5)
nRBC: 0 % (ref 0.0–0.2)

## 2019-12-28 NOTE — ED Triage Notes (Signed)
Pt sts generalized abdominal pain since 1600 today after drinking a whole bottle of wine. Sts hx of elevated ast/ alt

## 2019-12-28 NOTE — ED Notes (Signed)
Pt provided w/labeled urine specimen cup for collection. ENMiles 

## 2019-12-28 NOTE — ED Notes (Signed)
Pt immediately asking for something to eat Pt help to understand that she cannot have anything to eat especially if she is throwing up and having abdominal pain Pt informed that she cannot eat until the EDP gives the "ok" Pt stated that she becomes a "cranky bitch" when she doesn't eat

## 2019-12-29 ENCOUNTER — Emergency Department (HOSPITAL_COMMUNITY)
Admission: EM | Admit: 2019-12-29 | Discharge: 2019-12-29 | Disposition: A | Discharge status: Home / Self Care | Payer: Medicaid Other | Attending: Emergency Medicine | Admitting: Emergency Medicine

## 2019-12-29 DIAGNOSIS — R1084 Generalized abdominal pain: Secondary | ICD-10-CM

## 2019-12-29 DIAGNOSIS — Z91199 Patient's noncompliance with other medical treatment and regimen due to unspecified reason: Secondary | ICD-10-CM

## 2019-12-29 LAB — URINALYSIS, ROUTINE W REFLEX MICROSCOPIC
Bacteria, UA: NONE SEEN
Bilirubin Urine: NEGATIVE
Glucose, UA: NEGATIVE mg/dL
Ketones, ur: NEGATIVE mg/dL
Leukocytes,Ua: NEGATIVE
Nitrite: NEGATIVE
Protein, ur: NEGATIVE mg/dL
Specific Gravity, Urine: 1.027 (ref 1.005–1.030)
pH: 6 (ref 5.0–8.0)

## 2019-12-29 LAB — COMPREHENSIVE METABOLIC PANEL
ALT: 257 U/L — ABNORMAL HIGH (ref 0–44)
AST: 331 U/L — ABNORMAL HIGH (ref 15–41)
Albumin: 3.8 g/dL (ref 3.5–5.0)
Alkaline Phosphatase: 38 U/L (ref 38–126)
Anion gap: 12 (ref 5–15)
BUN: 16 mg/dL (ref 6–20)
CO2: 23 mmol/L (ref 22–32)
Calcium: 9.2 mg/dL (ref 8.9–10.3)
Chloride: 102 mmol/L (ref 98–111)
Creatinine, Ser: 0.41 mg/dL — ABNORMAL LOW (ref 0.61–1.24)
GFR calc Af Amer: >60 mL/min (ref >60)
GFR calc non Af Amer: >60 mL/min (ref >60)
Glucose, Bld: 132 mg/dL — ABNORMAL HIGH (ref 70–99)
Potassium: 3.2 mmol/L — ABNORMAL LOW (ref 3.5–5.1)
Sodium: 137 mmol/L (ref 135–145)
Total Bilirubin: 0.4 mg/dL (ref 0.3–1.2)
Total Protein: 8.3 g/dL — ABNORMAL HIGH (ref 6.5–8.1)

## 2019-12-29 LAB — LIPASE, BLOOD: Lipase: 30 U/L (ref 11–51)

## 2019-12-29 MED ORDER — SUCRALFATE 1 G PO TABS
1.0000 g | ORAL_TABLET | Freq: Three times a day (TID) | ORAL | 0 refills | Status: DC
Start: 2019-12-29 — End: 2020-02-18

## 2019-12-29 MED ORDER — SUCRALFATE 1 GM/10ML PO SUSP
1.0000 g | Freq: Three times a day (TID) | ORAL | Status: DC
  Administered 2019-12-29: 1 g via ORAL
  Filled 2019-12-29: qty 10

## 2019-12-29 MED ORDER — METOCLOPRAMIDE HCL 5 MG/ML IJ SOLN
10.0000 mg | Freq: Once | INTRAMUSCULAR | Status: AC
  Administered 2019-12-29: 10 mg via INTRAVENOUS
  Filled 2019-12-29: qty 2

## 2019-12-29 MED ORDER — METOCLOPRAMIDE HCL 10 MG PO TABS: 10.0000 mg | ORAL_TABLET | Freq: Four times a day (QID) | ORAL | 0 refills | Status: DC

## 2019-12-29 NOTE — ED Notes (Signed)
Unsuccessful IV attempt x 2.  Asked another RN to attempt.  

## 2019-12-29 NOTE — ED Notes (Signed)
Pt given graham crackers x3 and ginger ale per request.

## 2019-12-29 NOTE — Discharge Instructions (Addendum)
Your doctor is Dr. Ninetta Lights. He is a doctor of infectious disease and will manage your HIV.   You have been given a referral to the [facility] who will manage your other medical problems like prescribe your Keppra, and follow up on your abnormal liver tests.

## 2019-12-29 NOTE — ED Notes (Signed)
ED Provider at bedside. 

## 2019-12-29 NOTE — ED Provider Notes (Signed)
[facility]-EMERGENCY DEPT Provider Note   CSN: [id number] Arrival date & time: 12/28/19  2320     History Chief Complaint  Patient presents with  . Abdominal Pain    Jeremy Lane is a 24 y.o. male.  Patient to the emergency department with complaint of abdominal pain. He reports drinking alcohol last night and shortly afterward started having periumbilical abdominal pain, nausea, vomiting and diarrhea described as "the alcohol pouring out of me". No hematemesis or bloody stool. He states he started walking home and had increased waves of abdominal pain, prompting ED evaluation. No further vomiting or bowel movements. He reports that he was told he had abnormal liver tests previously and is concerned about his and his current abdominal pain. He reports he has a history of seizures but has not taken his Keppra in "months". He does not remember who his doctor is. He reports a history of HIV but is taking no medications. He does not remember the name of the doctor seen for this either.   The history is provided by the patient. No language interpreter was used.  Abdominal Pain Associated symptoms: diarrhea, nausea and vomiting   Associated symptoms: no chills and no fever        Past Medical History:  Diagnosis Date  . HIV (human immunodeficiency virus infection) (HCC) 12/27/2018  . Hypertension   . Seizures [facility])     Patient Active Problem List   Diagnosis Date Noted  . Rhabdomyolysis 01/05/2019  . HIV disease (HCC) 01/02/2019  . 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 Seizure (HCC) 12/27/2018  . Essential hypertension 12/27/2018  . Elevated LFTs 12/27/2018  . Polysubstance abuse (HCC) 12/27/2018    History reviewed. No pertinent surgical history.     Family History  Problem Relation Age of Onset  .  Diabetes Mother   . Hypertension Mother   . Diabetes Father   . Hypertension Father     Social History   Tobacco Use  . Smoking status: Current Every Day Smoker    Packs/day: 0.20    Types: Cigarettes  . Smokeless tobacco: Never Used  Substance Use Topics  . Alcohol use: Not Currently    Comment: states he has been drinking more lately because he is stressed  . Drug use: Yes    Types: Marijuana, Cocaine    Comment: cocaine "once in a blue moon"    Home Medications Prior to Admission medications   Medication Sig Start Date End Date Taking? Authorizing Provider  levETIRAcetam (KEPPRA) 500 MG tablet Take 1 tablet (500 mg total) by mouth 2 (two) times daily. 05/04/19  Yes Judyann Munson, MD  Darunavir-Cobicisctat-Emtricitabine-Tenofovir Alafenamide [facility]) 800-150-200-10 MG TABS Take 1 tablet by mouth daily with breakfast. Patient not taking: Reported on 12/29/2019 05/04/19   Judyann Munson, MD    Allergies    Dilantin [phenytoin] and Fosphenytoin  Review of Systems   Review of Systems  Constitutional: Negative for chills and fever.  HENT: Negative.   Respiratory: Negative.   Cardiovascular: Negative.   Gastrointestinal: Positive for abdominal pain, diarrhea, nausea and vomiting. Negative for blood in stool.  Musculoskeletal: Negative.   Skin: Negative.   Neurological: Negative.     Physical Exam Updated Vital Signs BP 123/88   Pulse 67   Temp 98.5 F (36.9 C)   Resp 18   Ht 5\' 6"  (  1.676 m)   Wt 54.4 kg   SpO2 100%   BMI 19.37 kg/m   Physical Exam Vitals and nursing note reviewed.  Constitutional:      Appearance: He is well-developed.  HENT:     Head: Normocephalic.  Cardiovascular:     Rate and Rhythm: Normal rate and regular rhythm.  Pulmonary:     Effort: Pulmonary effort is normal.     Breath sounds: Normal breath sounds. No wheezing, rhonchi or rales.  Abdominal:     General: Bowel sounds are normal.     Palpations: Abdomen is soft.      Tenderness: There is generalized abdominal tenderness (Greatest tenderness at periumbilical abdomen, but tenderness is generalized.) and tenderness in the periumbilical area. There is no guarding or rebound.  Musculoskeletal:        General: Normal range of motion.     Cervical back: Normal range of motion and neck supple.  Skin:    General: Skin is warm and dry.     Findings: No rash.  Neurological:     Mental Status: He is alert and oriented to person, place, and time.     ED Results / Procedures / Treatments   Labs (all labs ordered are listed, but only abnormal results are displayed) Labs Reviewed  COMPREHENSIVE METABOLIC PANEL - Abnormal; Notable for the following components:      Result Value   Potassium 3.2 (*)    Glucose, Bld 132 (*)    Creatinine, Ser 0.41 (*)    Total Protein 8.3 (*)    AST 331 (*)    ALT 257 (*)    All other components within normal limits  CBC - Abnormal; Notable for the following components:   RBC 4.17 (*)    Hemoglobin 11.6 (*)    HCT 36.0 (*)    All other components within normal limits  URINALYSIS, ROUTINE W REFLEX MICROSCOPIC - Abnormal; Notable for the following components:   Hgb urine dipstick SMALL (*)    All other components within normal limits  LIPASE, BLOOD   Results for orders placed or performed during the hospital encounter of 12/29/19  Lipase, blood  Result Value Ref Range   Lipase 30 11 - 51 U/L  Comprehensive metabolic panel  Result Value Ref Range   Sodium 137 135 - 145 mmol/L   Potassium 3.2 (L) 3.5 - 5.1 mmol/L   Chloride 102 98 - 111 mmol/L   CO2 23 22 - 32 mmol/L   Glucose, Bld 132 (H) 70 - 99 mg/dL   BUN 16 6 - 20 mg/dL   Creatinine, Ser 0.41 (L) 0.61 - 1.24 mg/dL   Calcium 9.2 8.9 - 10.3 mg/dL   Total Protein 8.3 (H) 6.5 - 8.1 g/dL   Albumin 3.8 3.5 - 5.0 g/dL   AST 331 (H) 15 - 41 U/L   ALT 257 (H) 0 - 44 U/L   Alkaline Phosphatase 38 38 - 126 U/L   Total Bilirubin 0.4 0.3 - 1.2 mg/dL   GFR calc non Af Amer  >60 >60 mL/min   GFR calc Af Amer >60 >60 mL/min   Anion gap 12 5 - 15  CBC  Result Value Ref Range   WBC 6.6 4.0 - 10.5 K/uL   RBC 4.17 (L) 4.22 - 5.81 MIL/uL   Hemoglobin 11.6 (L) 13.0 - 17.0 g/dL   HCT 36.0 (L) 39.0 - 52.0 %   MCV 86.3 80.0 - 100.0 fL   MCH 27.8 26.0 -  34.0 pg   MCHC 32.2 30.0 - 36.0 g/dL   RDW 16.1 09.6 - 04.5 %   Platelets 203 150 - 400 K/uL   nRBC 0.0 0.0 - 0.2 %  Urinalysis, Routine w reflex microscopic  Result Value Ref Range   Color, Urine YELLOW YELLOW   APPearance CLEAR CLEAR   Specific Gravity, Urine 1.027 1.005 - 1.030   pH 6.0 5.0 - 8.0   Glucose, UA NEGATIVE NEGATIVE mg/dL   Hgb urine dipstick SMALL (A) NEGATIVE   Bilirubin Urine NEGATIVE NEGATIVE   Ketones, ur NEGATIVE NEGATIVE mg/dL   Protein, ur NEGATIVE NEGATIVE mg/dL   Nitrite NEGATIVE NEGATIVE   Leukocytes,Ua NEGATIVE NEGATIVE   RBC / HPF 0-5 0 - 5 RBC/hpf   WBC, UA 0-5 0 - 5 WBC/hpf   Bacteria, UA NONE SEEN NONE SEEN   Mucus PRESENT    EKG None  Radiology No results found.  Procedures Procedures (including critical care time)  Medications Ordered in ED Medications  sucralfate (CARAFATE) 1 GM/10ML suspension 1 g (1 g Oral Given 12/29/19 0558)  metoCLOPramide (REGLAN) injection 10 mg (10 mg Intravenous Given 12/29/19 0559)    ED Course  I have reviewed the triage vital signs and the nursing notes.  Pertinent labs & imaging results that were available during my care of the patient were reviewed by me and considered in my medical decision making (see chart for details).    MDM Rules/Calculators/A&P                      Patient to ED for evaluation of abdominal pain after drinking alcohol last night.   He is overall well appearing. Labs reassuring. No leukocytosis. Transaminases are elevated, minimally elevated over previous visit 12/16/19. No other concerning abnormalities.  Patient given Reglan and carafate. On recheck, he reports he feels much better. VSS. No vomiting or  diarrhea while in the department. He is eating and drinking without vomiting.   The patient has been seen by Dr. Ninetta Lights in the past. He was referred back there on last discharge. Will refer again for ongoing care of chronic medical conditions.     Final Clinical Impression(s) / ED Diagnoses Final diagnoses:  None   1. Abdominal pain 2. Medical noncompliance.  Rx / DC Orders ED Discharge Orders    None       Elpidio Anis, Cordelia Poche 12/29/19 0710    Devoria Albe, MD 12/29/19 [phone]

## 2020-02-17 ENCOUNTER — Encounter (HOSPITAL_COMMUNITY): Payer: Self-pay | Admitting: Emergency Medicine

## 2020-02-17 ENCOUNTER — Emergency Department (HOSPITAL_COMMUNITY)
Admission: EM | Admit: 2020-02-17 | Discharge: 2020-02-18 | Disposition: A | Discharge status: Home / Self Care | Payer: Self-pay | Attending: Emergency Medicine | Admitting: Emergency Medicine

## 2020-02-17 DIAGNOSIS — F17210 Nicotine dependence, cigarettes, uncomplicated: Secondary | ICD-10-CM | POA: Insufficient documentation

## 2020-02-17 DIAGNOSIS — M542 Cervicalgia: Secondary | ICD-10-CM | POA: Insufficient documentation

## 2020-02-17 DIAGNOSIS — I10 Essential (primary) hypertension: Secondary | ICD-10-CM | POA: Insufficient documentation
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
## 2020-02-18 ENCOUNTER — Emergency Department (HOSPITAL_COMMUNITY): Payer: Self-pay

## 2020-02-18 MED ORDER — NAPROXEN 250 MG PO TABS
500.0000 mg | ORAL_TABLET | Freq: Once | ORAL | Status: AC
  Administered 2020-02-18: 500 mg via ORAL
  Filled 2020-02-18: qty 2

## 2020-02-18 MED ORDER — CYCLOBENZAPRINE HCL 10 MG PO TABS
10.0000 mg | ORAL_TABLET | Freq: Two times a day (BID) | ORAL | 0 refills | Status: DC | PRN
Start: 2020-02-18 — End: 2020-07-19

## 2020-02-18 MED ORDER — CYCLOBENZAPRINE HCL 10 MG PO TABS
10.0000 mg | ORAL_TABLET | Freq: Two times a day (BID) | ORAL | 0 refills | Status: DC | PRN
Start: 2020-02-18 — End: 2020-02-18
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
## 2020-02-18 NOTE — ED Notes (Addendum)
Pt verbalized understanding of discharge paperwork.

## 2020-02-18 NOTE — Discharge Instructions (Addendum)
You were evaluated in the Emergency Department and after careful evaluation, we did not find any emergent condition requiring admission or further testing in the hospital.  Your exam/testing today was overall reassuring.  Your symptoms seem to be due to muscle strain or spasm.  We recommend Tylenol at home for discomfort.  For more significant pain keeping you from sleeping, you can use the Flexeril muscle relaxer.  Please use caution with the muscle relaxer as it can cause drowsiness.  Please return to the Emergency Department if you experience any worsening of your condition.  Thank you for allowing [place] to be a part of your care.

## 2020-05-08 ENCOUNTER — Emergency Department (HOSPITAL_COMMUNITY): Payer: Self-pay

## 2020-05-08 ENCOUNTER — Emergency Department (HOSPITAL_COMMUNITY)
Admission: EM | Admit: 2020-05-08 | Discharge: 2020-05-08 | Disposition: A | Discharge status: Home / Self Care | Payer: Self-pay | Attending: Emergency Medicine | Admitting: Emergency Medicine

## 2020-05-08 ENCOUNTER — Other Ambulatory Visit: Payer: Self-pay

## 2020-05-08 DIAGNOSIS — F10920 Alcohol use, unspecified with intoxication, uncomplicated: Secondary | ICD-10-CM | POA: Insufficient documentation

## 2020-05-08 DIAGNOSIS — Z21 Asymptomatic human immunodeficiency virus [HIV] infection status: Secondary | ICD-10-CM | POA: Insufficient documentation

## 2020-05-08 DIAGNOSIS — E876 Hypokalemia: Secondary | ICD-10-CM | POA: Insufficient documentation

## 2020-05-08 LAB — COMPREHENSIVE METABOLIC PANEL
ALT: 288 U/L — ABNORMAL HIGH (ref 0–44)
AST: 387 U/L — ABNORMAL HIGH (ref 15–41)
Albumin: 3 g/dL — ABNORMAL LOW (ref 3.5–5.0)
Alkaline Phosphatase: 31 U/L — ABNORMAL LOW (ref 38–126)
Anion gap: 10 (ref 5–15)
BUN: 5 mg/dL — ABNORMAL LOW (ref 6–20)
CO2: 23 mmol/L (ref 22–32)
Calcium: 8.7 mg/dL — ABNORMAL LOW (ref 8.9–10.3)
Chloride: 109 mmol/L (ref 98–111)
Creatinine, Ser: 0.39 mg/dL — ABNORMAL LOW (ref 0.44–1.00)
GFR calc Af Amer: >60 mL/min (ref >60)
GFR calc non Af Amer: >60 mL/min (ref >60)
Glucose, Bld: 93 mg/dL (ref 70–99)
Potassium: 2.8 mmol/L — ABNORMAL LOW (ref 3.5–5.1)
Sodium: 142 mmol/L (ref 135–145)
Total Bilirubin: 0.4 mg/dL (ref 0.3–1.2)
Total Protein: 7.2 g/dL (ref 6.5–8.1)

## 2020-05-08 LAB — CBC
HCT: 32.3 % — ABNORMAL LOW (ref 36.0–46.0)
Hemoglobin: 10.4 g/dL — ABNORMAL LOW (ref 12.0–15.0)
MCH: 27.8 pg (ref 26.0–34.0)
MCHC: 32.2 g/dL (ref 30.0–36.0)
MCV: 86.4 fL (ref 80.0–100.0)
Platelets: 192 10*3/uL (ref 150–400)
RBC: 3.74 MIL/uL — ABNORMAL LOW (ref 3.87–5.11)
RDW: 12.5 % (ref 11.5–15.5)
WBC: 3.8 10*3/uL — ABNORMAL LOW (ref 4.0–10.5)
nRBC: 0 % (ref 0.0–0.2)

## 2020-05-08 LAB — ETHANOL: Alcohol, Ethyl (B): 191 mg/dL — ABNORMAL HIGH (ref <10)

## 2020-05-08 MED ORDER — POTASSIUM CHLORIDE CRYS ER 20 MEQ PO TBCR
40.0000 meq | EXTENDED_RELEASE_TABLET | Freq: Once | ORAL | Status: AC
  Administered 2020-05-08: 40 meq via ORAL
  Filled 2020-05-08: qty 2
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
## 2020-05-08 NOTE — ED Triage Notes (Signed)
Pt is altered in triage, opens eyes to verbal, will say name, unable to recall event from tonight. Denies drug use, does admit to alcohol.

## 2020-05-08 NOTE — ED Notes (Signed)
Patient transported to CT 

## 2020-05-08 NOTE — Discharge Instructions (Addendum)
You were evaluated in the Emergency Department and after careful evaluation, we did not find any emergent condition requiring admission or further testing in the hospital.  Your exam/testing today was overall reassuring.  Please return to the Emergency Department if you experience any worsening of your condition.  Thank you for allowing us to be a part of your care.  

## 2020-05-08 NOTE — ED Provider Notes (Addendum)
[facility]-EMER[facility]Y DEPT [facility] Emergency Department Provider Note MRN:  [id number]  Arrival date & time: 05/08/20     Chief Complaint   Altered Mental Status   History of Present Illness   Jeremy Lane is a 24 y.o. year-old male with a history of HIV presenting to the ED with chief complaint of altered mental status.  Drinking alcohol this evening, arriving with altered mental status, fall but no head trauma, feels nauseated, endorsing headache and abdominal discomfort which is mild.  Denies chest pain or shortness of breath, no numbness or weakness to the arms or legs.  Denies drug use.  Review of Systems  A complete 10 system review of systems was obtained and all systems are negative except as noted in the HPI and PMH.   Patient's Health History   No past medical history on file.  Past medical history: HIV Social history: Frequent alcohol use No family history on file.  Social History   Socioeconomic History  . Marital status: Single    Spouse name: Not on file  . Number of children: Not on file  . Years of education: Not on file  . Highest education level: Not on file  Occupational History  . Not on file  Tobacco Use  . Smoking status: Not on file  Substance and Sexual Activity  . Alcohol use: Not on file  . Drug use: Not on file  . Sexual activity: Not on file  Other Topics Concern  . Not on file  Social History Narrative  . Not on file   Social Determinants of Health   Financial Resource Strain:   . Difficulty of Paying Living Expenses: Not on file  Food Insecurity:   . Worried About Programme researcher, broadcasting/film/video in the Last Year: Not on file  . Ran Out of Food in the Last Year: Not on file  Transportation Needs:   . Lack of Transportation (Medical): Not on file  . Lack of Transportation (Non-Medical): Not on file  Physical Activity:   . Days of Exercise per Week: Not on file  . Minutes of Exercise per Session: Not on file  Stress:   . Feeling of  Stress : Not on file  Social Connections:   . Frequency of Communication with Friends and Family: Not on file  . Frequency of Social Gatherings with Friends and Family: Not on file  . Attends Religious Services: Not on file  . Active Member of Clubs or Organizations: Not on file  . Attends Banker Meetings: Not on file  . Marital Status: Not on file  Intimate Partner Violence:   . Fear of Current or Ex-Partner: Not on file  . Emotionally Abused: Not on file  . Physically Abused: Not on file  . Sexually Abused: Not on file     Physical Exam   Vitals:   05/08/20 0030 05/08/20 0607  BP: (!) 120/91 120/85  Pulse: 78 68  Resp: 13 17  Temp: 97.9 F (36.6 C)   SpO2: 100% 98%    CONSTITUTIONAL: Well-appearing, NAD NEURO:  Alert and oriented x 3, no focal deficits EYES:  eyes equal and reactive ENT/NECK:  no LAD, no JVD CARDIO: Regular rate, well-perfused, normal S1 and S2 PULM:  CTAB no wheezing or rhonchi GI/GU:  normal bowel sounds, non-distended, non-tender MSK/SPINE:  No gross deformities, no edema SKIN:  no rash, atraumatic PSYCH:  Appropriate speech and behavior  *Additional and/or pertinent findings included in MDM below  Diagnostic  and Interventional Summary    EKG Interpretation  Date/Time:  Sunday May 08 2020 08:29:42 EDT Ventricular Rate:  71 PR Interval:  118 QRS Duration: 78 QT Interval:  390 QTC Calculation: 423 R Axis:   28 Text Interpretation: Normal sinus rhythm Nonspecific T wave abnormality Abnormal ECG Confirmed by Kennis Carina [id number]) on 05/08/2020 10:29:42 AM      Labs Reviewed  COMPREHENSIVE METABOLIC PANEL - Abnormal; Notable for the following components:      Result Value   Potassium 2.8 (*)    BUN 5 (*)    Creatinine, Ser 0.39 (*)    Calcium 8.7 (*)    Albumin 3.0 (*)    AST 387 (*)    ALT 288 (*)    Alkaline Phosphatase 31 (*)    All other components within normal limits  ETHANOL - Abnormal; Notable for the  following components:   Alcohol, Ethyl (B) 191 (*)    All other components within normal limits  CBC - Abnormal; Notable for the following components:   WBC 3.8 (*)    RBC 3.74 (*)    Hemoglobin 10.4 (*)    HCT 32.3 (*)    All other components within normal limits  RAPID URINE DRUG SCREEN, HOSP PERFORMED    CT Head Wo Contrast  Final Result      Medications  potassium chloride SA (KLOR-CON) CR tablet 40 mEq (40 mEq Oral Given 05/08/20 0853)     Procedures  /  Critical Care Procedures  ED Course and Medical Decision Making  I have reviewed the triage vital signs, the nursing notes, and pertinent available records from the EMR.  Listed above are laboratory and imaging tests that I personally ordered, reviewed, and interpreted and then considered in my medical decision making (see below for details).  Continued headache and vomiting after fall, alcohol level elevated but not significantly and this was several hours ago and so will obtain CT head to exclude intracranial bleeding.     CT is normal, LFTs mildly elevated but at patient's baseline, potassium repleted.  Patient is now looking and feeling better, clinically sober, appropriate for discharge.  Elmer Sow. Pilar Plate, MD [facility] Emergency Medicine [facility] [email or web address]  Final Clinical Impressions(s) / ED Diagnoses     ICD-10-CM   1. Hypokalemia  E87.6   2. Alcoholic intoxication without complication (HCC)  Y30.160     ED Discharge Orders    None       Discharge Instructions Discussed with and Provided to Patient:     Discharge Instructions     You were evaluated in the Emergency Department and after careful evaluation, we did not find any emergent condition requiring admission or further testing in the hospital.  Your exam/testing today was overall reassuring.  Please return to the Emergency Department if you experience any worsening of your condition.  Thank you for allowing [place] to  be a part of your care.        Sabas Sous, MD 05/08/20 1044    Sabas Sous, MD 05/08/20 1045
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
## 2020-06-25 ENCOUNTER — Emergency Department (HOSPITAL_COMMUNITY)
Admission: EM | Admit: 2020-06-25 | Discharge: 2020-06-25 | Disposition: A | Discharge status: Home / Self Care | Payer: Medicaid Other | Attending: Emergency Medicine | Admitting: Emergency Medicine

## 2020-06-25 ENCOUNTER — Other Ambulatory Visit: Payer: Self-pay

## 2020-06-25 ENCOUNTER — Encounter (HOSPITAL_COMMUNITY): Payer: Self-pay | Admitting: *Deleted

## 2020-06-25 DIAGNOSIS — I10 Essential (primary) hypertension: Secondary | ICD-10-CM | POA: Insufficient documentation

## 2020-06-25 DIAGNOSIS — F17210 Nicotine dependence, cigarettes, uncomplicated: Secondary | ICD-10-CM | POA: Insufficient documentation

## 2020-06-25 DIAGNOSIS — J0120 Acute ethmoidal sinusitis, unspecified: Secondary | ICD-10-CM | POA: Insufficient documentation

## 2020-06-25 DIAGNOSIS — H53149 Visual discomfort, unspecified: Secondary | ICD-10-CM | POA: Insufficient documentation

## 2020-06-25 DIAGNOSIS — N6320 Unspecified lump in the left breast, unspecified quadrant: Secondary | ICD-10-CM | POA: Insufficient documentation

## 2020-06-25 MED ORDER — ACETAMINOPHEN 500 MG PO TABS
500.0000 mg | ORAL_TABLET | Freq: Four times a day (QID) | ORAL | 0 refills | Status: DC | PRN
Start: 2020-06-25 — End: 2020-07-19

## 2020-06-25 MED ORDER — AMOXICILLIN-POT CLAVULANATE 875-125 MG PO TABS
1.0000 | ORAL_TABLET | Freq: Two times a day (BID) | ORAL | 0 refills | Status: DC
Start: 2020-06-25 — End: 2020-07-19
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
## 2020-06-25 NOTE — Discharge Instructions (Signed)
Your headache is likely due to sinusitis.  Take antibiotic as prescribed.  Also times of the head injury your symptoms could also be due to a postconcussive syndrome causing recurrent headache.  You may follow-up at the concussion clinic by Dr. Antoine Primas for further care.  Call and follow-up with the breast center for ultrasound of your left breast next week.
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
## 2020-07-18 ENCOUNTER — Ambulatory Visit (HOSPITAL_COMMUNITY)
Admission: EM | Admit: 2020-07-18 | Discharge: 2020-07-19 | Disposition: A | Discharge status: Home / Self Care | Payer: No Payment, Other | Attending: Psychiatry | Admitting: Psychiatry

## 2020-07-18 ENCOUNTER — Encounter (HOSPITAL_COMMUNITY): Payer: Self-pay | Admitting: Emergency Medicine

## 2020-07-18 ENCOUNTER — Other Ambulatory Visit: Payer: Self-pay

## 2020-07-18 DIAGNOSIS — R45851 Suicidal ideations: Secondary | ICD-10-CM | POA: Diagnosis not present

## 2020-07-18 DIAGNOSIS — F332 Major depressive disorder, recurrent severe without psychotic features: Secondary | ICD-10-CM | POA: Diagnosis not present

## 2020-07-18 DIAGNOSIS — Z20822 Contact with and (suspected) exposure to covid-19: Secondary | ICD-10-CM | POA: Diagnosis not present

## 2020-07-18 DIAGNOSIS — T1491XA Suicide attempt, initial encounter: Secondary | ICD-10-CM

## 2020-07-18 LAB — POCT URINE DRUG SCREEN - MANUAL ENTRY (I-SCREEN)
POC Amphetamine UR: POSITIVE — AB
POC Buprenorphine (BUP): NOT DETECTED
POC Cocaine UR: POSITIVE — AB
POC Marijuana UR: POSITIVE — AB
POC Methadone UR: NOT DETECTED
POC Methamphetamine UR: POSITIVE — AB
POC Morphine: NOT DETECTED
POC Oxazepam (BZO): NOT DETECTED
POC Oxycodone UR: NOT DETECTED
POC Secobarbital (BAR): NOT DETECTED

## 2020-07-18 LAB — POC SARS CORONAVIRUS 2 AG -  ED: SARS Coronavirus 2 Ag: NEGATIVE

## 2020-07-18 MED ORDER — ACETAMINOPHEN 325 MG PO TABS: 650.0000 mg | ORAL_TABLET | Freq: Four times a day (QID) | ORAL | Status: DC | PRN

## 2020-07-18 MED ORDER — TRAZODONE HCL 50 MG PO TABS
50.0000 mg | ORAL_TABLET | Freq: Every evening | ORAL | Status: DC | PRN
  Administered 2020-07-18: 50 mg via ORAL
  Filled 2020-07-18: qty 1

## 2020-07-18 MED ORDER — HYDROXYZINE HCL 25 MG PO TABS
25.0000 mg | ORAL_TABLET | Freq: Three times a day (TID) | ORAL | Status: DC | PRN
  Administered 2020-07-18: 25 mg via ORAL
  Filled 2020-07-18: qty 1

## 2020-07-18 MED ORDER — BICTEGRAVIR-EMTRICITAB-TENOFOV 50-200-25 MG PO TABS
1.0000 | ORAL_TABLET | Freq: Every day | ORAL | Status: DC
  Administered 2020-07-19: 1 via ORAL
  Filled 2020-07-18 (×2): qty 1

## 2020-07-18 MED ORDER — LEVETIRACETAM 500 MG PO TABS
500.0000 mg | ORAL_TABLET | Freq: Two times a day (BID) | ORAL | Status: DC
  Administered 2020-07-18 – 2020-07-19 (×2): 500 mg via ORAL
  Filled 2020-07-18: qty 1
  Filled 2020-07-18: qty 14
  Filled 2020-07-18: qty 1

## 2020-07-18 MED ORDER — MAGNESIUM HYDROXIDE 400 MG/5ML PO SUSP: 30.0000 mL | Freq: Every day | ORAL | Status: DC | PRN

## 2020-07-18 MED ORDER — ALUM & MAG HYDROXIDE-SIMETH 200-200-20 MG/5ML PO SUSP: 30.0000 mL | ORAL | Status: DC | PRN
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
## 2020-07-18 NOTE — ED Notes (Signed)
Pt is using phone to call his partner. Pt is calm and cooperative w/o any c/o of pain or distress. Will continue to monitor pt for safety
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
## 2020-07-18 NOTE — ED Notes (Signed)
Pt A&O x 4, presents with SI, wrapped a shirt around his neck at a hotel.  Denies HI or AVH.  Feeling hopeless and tearful.  Skin search completed, no distress noted.  Monitoring for safety.
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
## 2020-07-19 LAB — CBC WITH DIFFERENTIAL/PLATELET
Abs Immature Granulocytes: 0.01 10*3/uL (ref 0.00–0.07)
Basophils Absolute: 0 10*3/uL (ref 0.0–0.1)
Basophils Relative: 0 %
Eosinophils Absolute: 0.1 10*3/uL (ref 0.0–0.5)
Eosinophils Relative: 1 %
HCT: 36.6 % — ABNORMAL LOW (ref 39.0–52.0)
Hemoglobin: 12.2 g/dL — ABNORMAL LOW (ref 13.0–17.0)
Immature Granulocytes: 0 %
Lymphocytes Relative: 55 %
Lymphs Abs: 2.3 10*3/uL (ref 0.7–4.0)
MCH: 28 pg (ref 26.0–34.0)
MCHC: 33.3 g/dL (ref 30.0–36.0)
MCV: 84.1 fL (ref 80.0–100.0)
Monocytes Absolute: 0.3 10*3/uL (ref 0.1–1.0)
Monocytes Relative: 7 %
Neutro Abs: 1.5 10*3/uL — ABNORMAL LOW (ref 1.7–7.7)
Neutrophils Relative %: 37 %
Platelets: 233 10*3/uL (ref 150–400)
RBC: 4.35 MIL/uL (ref 4.22–5.81)
RDW: 12.8 % (ref 11.5–15.5)
WBC: 4.2 10*3/uL (ref 4.0–10.5)
nRBC: 0 % (ref 0.0–0.2)

## 2020-07-19 LAB — COMPREHENSIVE METABOLIC PANEL
ALT: 275 U/L — ABNORMAL HIGH (ref 0–44)
AST: 399 U/L — ABNORMAL HIGH (ref 15–41)
Albumin: 3.3 g/dL — ABNORMAL LOW (ref 3.5–5.0)
Alkaline Phosphatase: 41 U/L (ref 38–126)
Anion gap: 9 (ref 5–15)
BUN: 13 mg/dL (ref 6–20)
CO2: 25 mmol/L (ref 22–32)
Calcium: 9.2 mg/dL (ref 8.9–10.3)
Chloride: 104 mmol/L (ref 98–111)
Creatinine, Ser: 0.39 mg/dL — ABNORMAL LOW (ref 0.61–1.24)
GFR, Estimated: >60 mL/min (ref >60)
Glucose, Bld: 113 mg/dL — ABNORMAL HIGH (ref 70–99)
Potassium: 2.8 mmol/L — ABNORMAL LOW (ref 3.5–5.1)
Sodium: 138 mmol/L (ref 135–145)
Total Bilirubin: 0.5 mg/dL (ref 0.3–1.2)
Total Protein: 8.5 g/dL — ABNORMAL HIGH (ref 6.5–8.1)

## 2020-07-19 LAB — RESP PANEL BY RT-PCR (FLU A&B, COVID) ARPGX2
Influenza A by PCR: NEGATIVE
Influenza B by PCR: NEGATIVE
SARS Coronavirus 2 by RT PCR: NEGATIVE

## 2020-07-19 MED ORDER — POTASSIUM CHLORIDE CRYS ER 20 MEQ PO TBCR
40.0000 meq | EXTENDED_RELEASE_TABLET | ORAL | Status: AC
  Administered 2020-07-19 (×2): 40 meq via ORAL
  Filled 2020-07-19 (×2): qty 2

## 2020-07-19 NOTE — ED Notes (Signed)
Personal belongings returned to Pt and escorted to sally port. Safety maintained.

## 2020-07-19 NOTE — ED Provider Notes (Signed)
FBC/OBS ASAP Discharge Summary  Date and Time: 07/19/2020 4:15 PM  Name: Jeremy Lane  MRN:  [id number]   Discharge Diagnoses:  Final diagnoses:  Severe episode of recurrent major depressive disorder, without psychotic features (HCC)  Suicide attempt [facility])    Subjective:   Attempted to interview patient several times throughout the day, however, unable to be awakened. UDS+methamphetamine, cocaine, marijuana amphetamine. Patient able to be awoken this afternoon. He states that he got into an argument with his significant other which led him to the actions that brought him to the hospital. He denies SI/HI/AVH. He states that he has never been in a "suicidal hospital" and attributes it to his "toxic relationship". He describes numerous stressors in the relationship and states that he is ready to end it since it has led him to this point. Pt requests discharge, asks if he we can charge his phone, and asks for transportation from where he was picked up to get his belongings. He states that at some point he plans to go back to [place] where he is from. Pt acknowledges drug use, but is ambivalent about substance use treatment.   Stay Summary:  Jeremy Lane. Jeremy Lane (patient prefers to go by "J") is a 24 y.o. male with a history of HIV who presents to the [facility] via police with chief complaint of suicide attempt on 12/13. Patient states that he tried to commit suicide around 5:00/6:00 PM in a hotel on 07/18/2020 by choking/strangling himself with a shirt, which he states was unsuccessful. He reports he thought about attempting this again, but decided to call 911 instead. There are no ligature marks. Patient states this is the first time he has experienced SI since he was 24 years old and states he has never acted on his SI before until now. Patient states he has a hard time controlling his emotions and has held in all of his feelings until now (states he "couldn't take it anymore" and needed to get  help). He was admitted for observation for safety and stabilization. On day of discharge he denies SI/HI/AVH and feels safe for discharge. He is future oriented and plans on going back to [place].  Patient attempted to call significant other several times but went straight to VM Pt provided consent for me to speak with significant other as well. I called significant other ~4 times throughout the day, each time went directly to VM  Total Time spent with patient: 30 minutes  Past Psychiatric History: see h&P Past Medical History:  Past Medical History:  Diagnosis Date  . HIV (human immunodeficiency virus infection) (HCC) 12/27/2018  . Hypertension   . Seizures (HCC)    History reviewed. No pertinent surgical history. Family History:  Family History  Problem Relation Age of Onset  . Diabetes Mother   . Hypertension Mother   . Diabetes Father   . Hypertension Father    Family Psychiatric History: see H&P Social History:  Social History   Substance and Sexual Activity  Alcohol Use Not Currently   Comment: states he has been drinking more lately because he is stressed     Social History   Substance and Sexual Activity  Drug Use Yes  . Types: Marijuana, Cocaine   Comment: cocaine "once in a blue moon"    Social History   Socioeconomic History  . Marital status: Single    Spouse name: Not on file  . Number of children: Not on file  . Years of education: Not on  file  . Highest education level: Not on file  Occupational History  . Not on file  Tobacco Use  . Smoking status: Current Every Day Smoker    Packs/day: 0.20    Types: Cigarettes  . Smokeless tobacco: Never Used  Substance and Sexual Activity  . Alcohol use: Not Currently    Comment: states he has been drinking more lately because he is stressed  . Drug use: Yes    Types: Marijuana, Cocaine    Comment: cocaine "once in a blue moon"  . Sexual activity: Yes    Partners: Male    Comment: refused condoms   Other Topics Concern  . Not on file  Social History Narrative  . Not on file   Social Determinants of Health   Financial Resource Strain: Not on file  Food Insecurity: Not on file  Transportation Needs: Not on file  Physical Activity: Not on file  Stress: Not on file  Social Connections: Not on file   SDOH:  SDOH Screenings   Alcohol Screen: Not on file  Depression (HGD9-2): Not on file  Financial Resource Strain: Not on file  Food Insecurity: Not on file  Housing: Not on file  Physical Activity: Not on file  Social Connections: Not on file  Stress: Not on file  Tobacco Use: High Risk  . Smoking Tobacco Use: Current Every Day Smoker  . Smokeless Tobacco Use: Never Used  Transportation Needs: Not on file    Has this patient used any form of tobacco in the last 30 days? (Cigarettes, Smokeless Tobacco, Cigars, and/or Pipes) Prescription not provided because: n/a  Current Medications:  Current Facility-Administered Medications  Medication Dose Route Frequency Provider Last Rate Last Admin  . acetaminophen (TYLENOL) tablet 650 mg  650 mg Oral Q6H PRN Jaclyn Shaggy, PA-C      . alum & mag hydroxide-simeth (MAALOX/MYLANTA) 200-200-20 MG/5ML suspension 30 mL  30 mL Oral Q4H PRN Jaclyn Shaggy, PA-C      . bictegravir-emtricitabine-tenofovir AF (BIKTARVY) 50-200-25 MG per tablet 1 tablet  1 tablet Oral Daily Jaclyn Shaggy, PA-C   1 tablet at 07/19/20 0930  . hydrOXYzine (ATARAX/VISTARIL) tablet 25 mg  25 mg Oral TID PRN Jaclyn Shaggy, PA-C   25 mg at 07/18/20 2301  . levETIRAcetam (KEPPRA) tablet 500 mg  500 mg Oral BID Melbourne Abts W, PA-C   500 mg at 07/19/20 0930  . magnesium hydroxide (MILK OF MAGNESIA) suspension 30 mL  30 mL Oral Daily PRN Jaclyn Shaggy, PA-C      . traZODone (DESYREL) tablet 50 mg  50 mg Oral QHS PRN Jaclyn Shaggy, PA-C   50 mg at 07/18/20 2301   Current Outpatient Medications  Medication Sig Dispense Refill  . bictegravir-emtricitabine-tenofovir AF  (BIKTARVY) 50-200-25 MG TABS tablet Take 1 tablet by mouth daily.    . Darunavir-Cobicisctat-Emtricitabine-Tenofovir Alafenamide (SYMTUZA) 800-150-200-10 MG TABS Take 1 tablet by mouth daily with breakfast. (Patient not taking: Reported on 12/29/2019) 30 tablet 11  . levETIRAcetam (KEPPRA) 500 MG tablet Take 1 tablet (500 mg total) by mouth 2 (two) times daily. (Patient not taking: Reported on 07/19/2020) 60 tablet 11  . PRESCRIPTION MEDICATION Take 1 each by mouth daily. Depakote      PTA Medications: (Not in a hospital admission)   Musculoskeletal  Strength & Muscle Tone: within normal limits Gait & Station: normal Patient leans: N/A  Psychiatric Specialty Exam  Presentation  General Appearance: Appropriate for Environment; Casual; Fairly Groomed  Eye Contact:Good  Speech:Normal Rate  Speech Volume:Normal  Handedness:No data recorded  Mood and Affect  Mood:Dysphoric  Affect:Appropriate; Congruent   Thought Process  Thought Processes:Coherent; Goal Directed; Linear  Descriptions of Associations:Intact  Orientation:Full (Time, Place and Person)  Thought Content:WDL  Hallucinations:Hallucinations: None  Ideas of Reference:None  Suicidal Thoughts:Suicidal Thoughts: No SI Passive Intent and/or Plan: Without Intent; Without Plan  Homicidal Thoughts:Homicidal Thoughts: No   Sensorium  Memory:Immediate Good; Recent Good; Remote Fair  Judgment:Fair  Insight:Fair   Executive Functions  Concentration:Fair  Attention Span:Fair  Recall:Fair  Fund of Knowledge:Fair  Language:Good   Psychomotor Activity  Psychomotor Activity:Psychomotor Activity: Normal   Assets  Assets:Communication Skills; Desire for Improvement; Physical Health; Resilience   Sleep  Sleep:Sleep: Fair   Physical Exam  Physical Exam ROS Blood pressure 113/69, pulse 84, temperature 98.1 F (36.7 C), temperature source Oral, resp. rate 16, SpO2 100 %. There is no height or weight  on file to calculate BMI.  Demographic Factors:  Male, Adolescent or young adult, Gay, lesbian, or bisexual orientation and Low socioeconomic status  Loss Factors: NA  Historical Factors: Impulsivity  Risk Reduction Factors:   Positive coping skills or problem solving skills  Continued Clinical Symptoms:  Medical Diagnoses and Treatments/Surgeries  Cognitive Features That Contribute To Risk:  None    Suicide Risk:  Minimal: No identifiable suicidal ideation.  Patients presenting with no risk factors but with morbid ruminations; may be classified as minimal risk based on the severity of the depressive symptoms  Plan Of Care/Follow-up recommendations:  Activity:  as tolerated Diet:  regular Other:     Patient is instructed prior to discharge to: Take all medications as prescribed by his/her mental healthcare provider. Report any adverse effects and or reactions from the medicines to his/her outpatient provider promptly. Patient has been instructed & cautioned: To not engage in alcohol and or illegal drug use while on prescription medicines. In the event of worsening symptoms, patient is instructed to call the crisis hotline, 911 and or go to the nearest ED for appropriate evaluation and treatment of symptoms. To follow-up with his/her primary care provider for your other medical issues, concerns and or health care needs.    Discussed open access hours at [facility]  Disposition: self care  Estella Husk, MD 07/19/2020, 4:15 PM

## 2020-07-19 NOTE — ED Notes (Signed)
Pt sleeping. No s&s of distress. Safety maintained and will continue to monitor.  

## 2020-07-19 NOTE — ED Notes (Signed)
Pt sleeping@this time. Breathing even and unlabored. Will continue to monitor for safety 

## 2020-07-19 NOTE — Discharge Instructions (Addendum)

## 2020-07-19 NOTE — ED Notes (Signed)
Discharge instructions provided along with prescription samples. Pt stated understanding. Safe transport called for transportation services. Personal belongings to be returned upon escort to the [facility].

## 2020-07-19 NOTE — ED Notes (Signed)
Pt sleeping@this time. Breathing even and unlabored. Will continue to monitor pt for safety 

## 2021-01-04 DEATH — deceased

## 2021-06-22 ENCOUNTER — Telehealth: Payer: Self-pay

## 2021-06-22 NOTE — Telephone Encounter (Signed)
Patient last seen 04/2019, called to offer appointment and re-engage in care. Call could not be completed.   Sandie Ano, RN

## 2021-11-20 ENCOUNTER — Telehealth: Payer: Self-pay

## 2021-11-20 NOTE — Telephone Encounter (Signed)
Called patient to offer appointment, no answer and no voicemail box.  ? ?Referral sent to state bridge counselor 10/2021. ? ?Sandie Ano, RN ? ?

## 2021-12-27 ENCOUNTER — Encounter: Payer: Self-pay | Admitting: Infectious Diseases
